# Patient Record
Sex: Female | Born: 1940 | Race: White | Hispanic: No | Smoking: Current every day smoker
Health system: Southern US, Community
[De-identification: ages and names within clinical notes are randomized; demographics above are authoritative.]

## PROBLEM LIST (undated history)

## (undated) DIAGNOSIS — J349 Unspecified disorder of nose and nasal sinuses: Secondary | ICD-10-CM

## (undated) DIAGNOSIS — IMO0001 Reserved for inherently not codable concepts without codable children: Secondary | ICD-10-CM

## (undated) DIAGNOSIS — D649 Anemia, unspecified: Secondary | ICD-10-CM

## (undated) DIAGNOSIS — K649 Unspecified hemorrhoids: Secondary | ICD-10-CM

## (undated) DIAGNOSIS — M797 Fibromyalgia: Secondary | ICD-10-CM

## (undated) DIAGNOSIS — J439 Emphysema, unspecified: Secondary | ICD-10-CM

## (undated) DIAGNOSIS — G64 Other disorders of peripheral nervous system: Secondary | ICD-10-CM

## (undated) DIAGNOSIS — K219 Gastro-esophageal reflux disease without esophagitis: Secondary | ICD-10-CM

## (undated) DIAGNOSIS — S82899A Other fracture of unspecified lower leg, initial encounter for closed fracture: Secondary | ICD-10-CM

## (undated) DIAGNOSIS — I219 Acute myocardial infarction, unspecified: Secondary | ICD-10-CM

## (undated) DIAGNOSIS — J45909 Unspecified asthma, uncomplicated: Secondary | ICD-10-CM

## (undated) DIAGNOSIS — Z972 Presence of dental prosthetic device (complete) (partial): Secondary | ICD-10-CM

## (undated) DIAGNOSIS — I10 Essential (primary) hypertension: Secondary | ICD-10-CM

## (undated) HISTORY — DX: Unspecified disorder of nose and nasal sinuses: J34.9

## (undated) HISTORY — PX: ABDOMINAL HYSTERECTOMY: SHX81

## (undated) HISTORY — DX: Unspecified asthma, uncomplicated: J45.909

## (undated) HISTORY — DX: Other fracture of unspecified lower leg, initial encounter for closed fracture: S82.899A

## (undated) HISTORY — DX: Acute myocardial infarction, unspecified: I21.9

## (undated) HISTORY — PX: APPENDECTOMY: SHX54

## (undated) HISTORY — DX: Emphysema, unspecified: J43.9

## (undated) HISTORY — DX: Other disorders of peripheral nervous system: G64

## (undated) HISTORY — DX: Essential (primary) hypertension: I10

---

## 1968-10-14 HISTORY — PX: TUBAL LIGATION: SHX77

## 1978-10-14 HISTORY — PX: PARTIAL HYSTERECTOMY: SHX80

## 2005-04-19 ENCOUNTER — Emergency Department: Payer: Self-pay | Admitting: Emergency Medicine

## 2005-04-19 ENCOUNTER — Other Ambulatory Visit: Payer: Self-pay

## 2006-05-12 ENCOUNTER — Ambulatory Visit: Payer: Self-pay | Admitting: Gastroenterology

## 2006-05-12 LAB — HM COLONOSCOPY

## 2006-07-15 ENCOUNTER — Ambulatory Visit: Payer: Self-pay | Admitting: Otolaryngology

## 2006-08-06 ENCOUNTER — Inpatient Hospital Stay: Payer: Self-pay | Admitting: Otolaryngology

## 2006-08-07 ENCOUNTER — Other Ambulatory Visit: Payer: Self-pay

## 2006-10-14 HISTORY — PX: OTHER SURGICAL HISTORY: SHX169

## 2008-01-29 ENCOUNTER — Ambulatory Visit: Payer: Self-pay | Admitting: Family Medicine

## 2010-09-24 ENCOUNTER — Ambulatory Visit: Payer: Self-pay | Admitting: Family Medicine

## 2011-03-06 ENCOUNTER — Ambulatory Visit: Payer: Self-pay | Admitting: Neurology

## 2011-05-25 ENCOUNTER — Inpatient Hospital Stay: Payer: Self-pay | Admitting: Internal Medicine

## 2011-06-19 ENCOUNTER — Observation Stay: Payer: Self-pay | Admitting: Internal Medicine

## 2011-10-15 DIAGNOSIS — I219 Acute myocardial infarction, unspecified: Secondary | ICD-10-CM

## 2011-10-15 HISTORY — DX: Acute myocardial infarction, unspecified: I21.9

## 2011-10-15 HISTORY — PX: ANGIOPLASTY: SHX39

## 2011-11-29 LAB — HM MAMMOGRAPHY

## 2012-03-05 ENCOUNTER — Ambulatory Visit: Payer: Self-pay | Admitting: Cardiology

## 2013-02-13 IMAGING — CT CT HEAD WITHOUT CONTRAST
2 series · 16 of 30 positions shown, 20 images · non-contrast
Comparison: none

REASON FOR EXAM: CVA
COMMENTS:   May transport without cardiac monitor

PROCEDURE:     CT  - CT HEAD WITHOUT CONTRAST  - May 25, 2011  [DATE]
RESULT:     Technique: Helical 5mm sections were obtained from the skull
base to the vertex without administration of intravenous contrast.

[Series 2: without · axial · non-contrast · 0.43mm/px · z∈[-179,-59]mm · 13 of 29 slices shown, 17 images]
[im 3/29  brain]
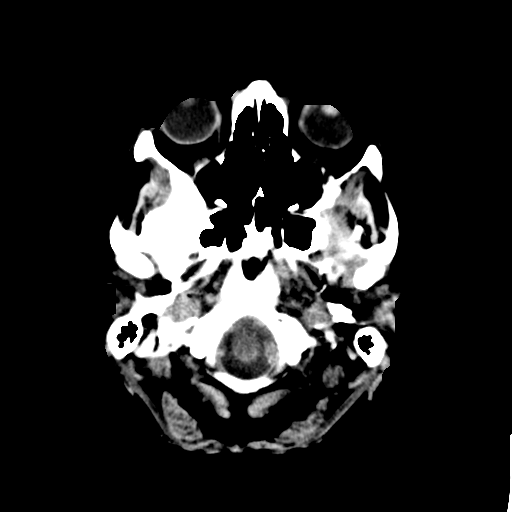
[im 3/29  bone]
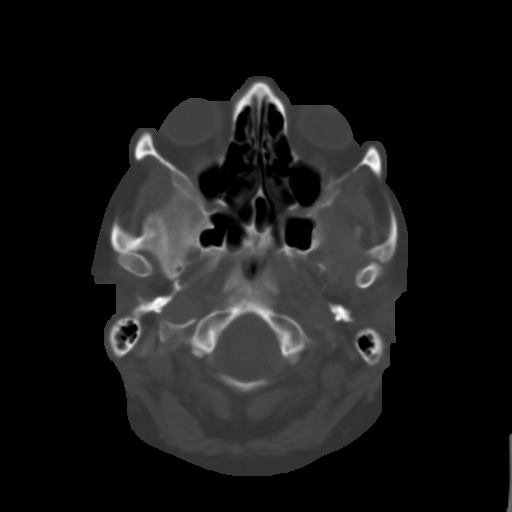
[im 5/29  brain]
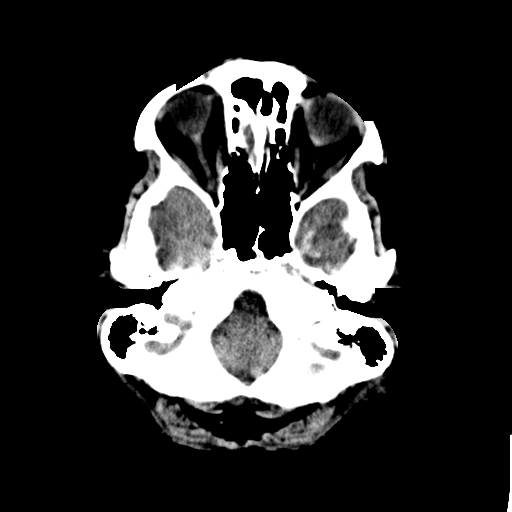
[im 7/29  brain]
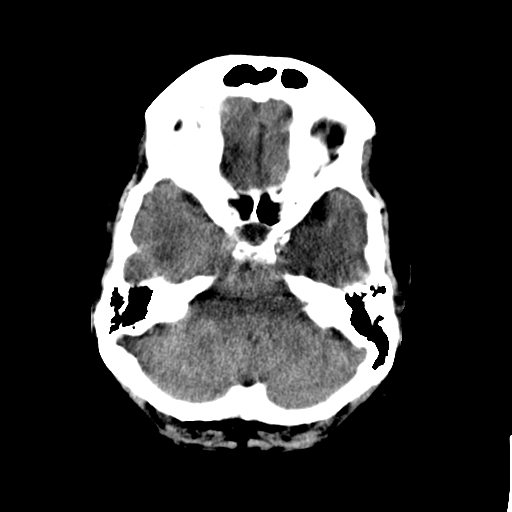
[im 9/29  brain]
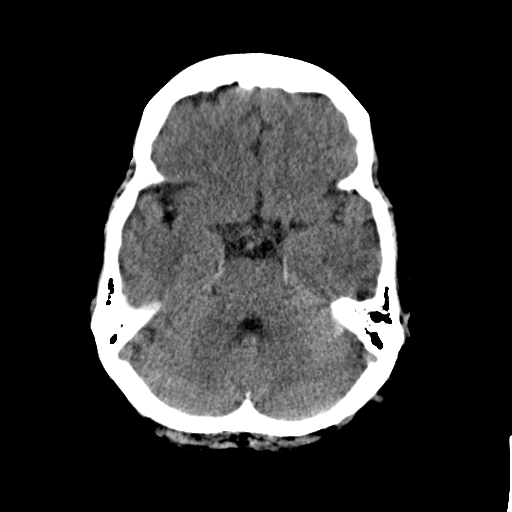
[im 11/29  brain]
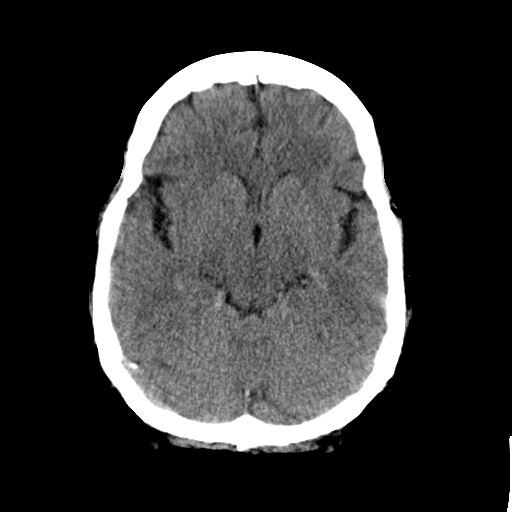
[im 11/29  bone]
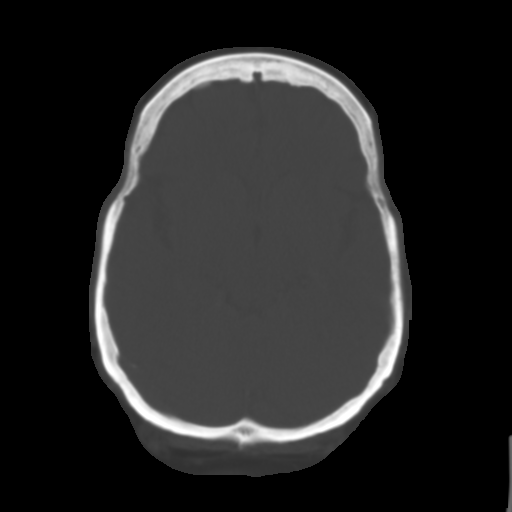
[im 13/29  brain]
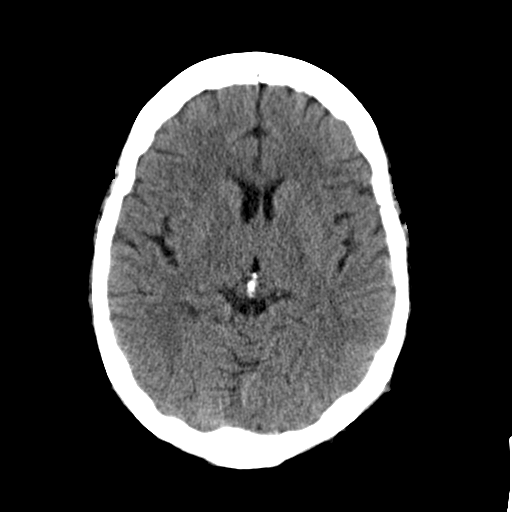
[im 15/29  brain]
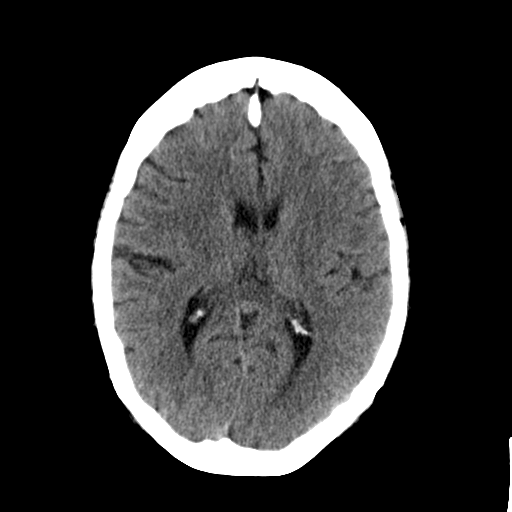
[im 17/29  brain]
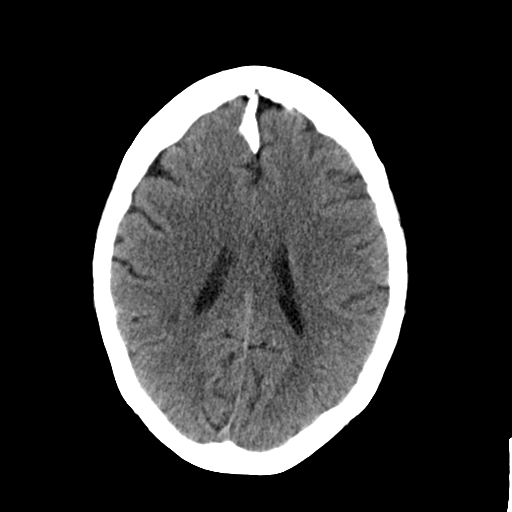
[im 19/29  brain]
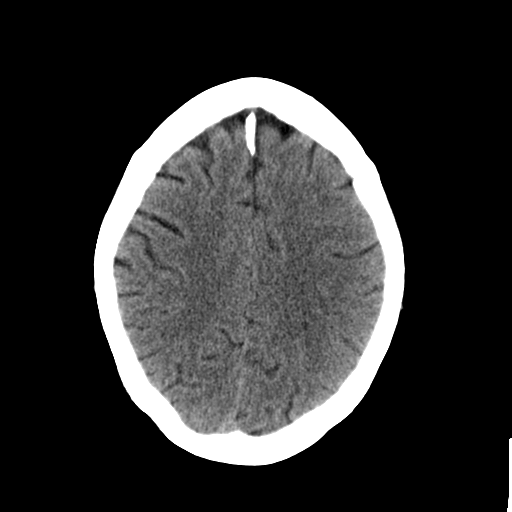
[im 19/29  bone]
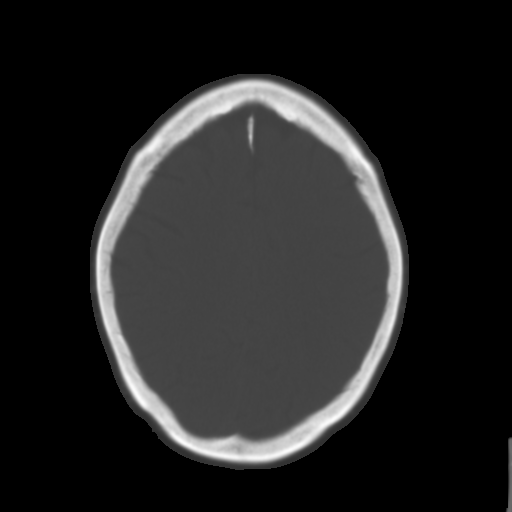
[im 21/29  brain]
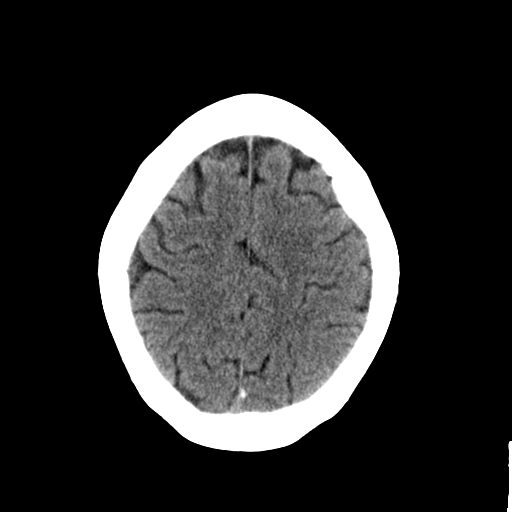
[im 23/29  brain]
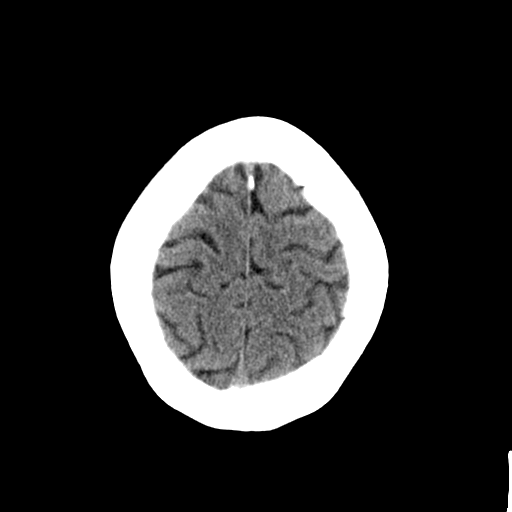
[im 25/29  brain]
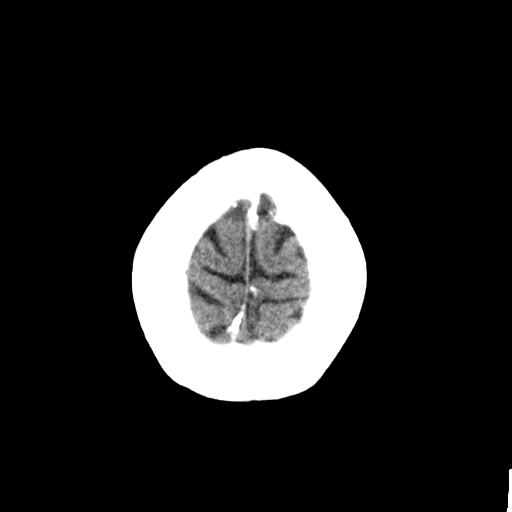
[im 27/29  brain]
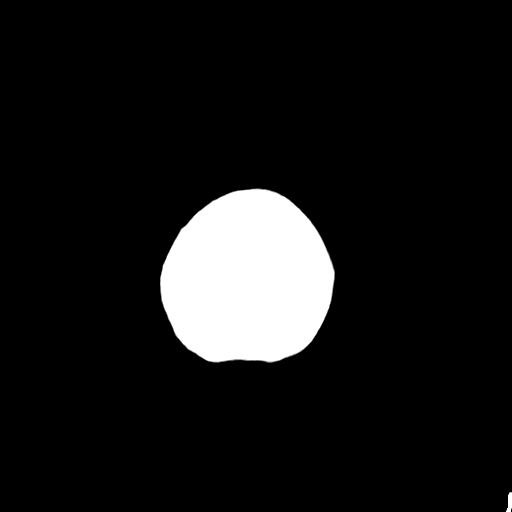
[im 27/29  bone]
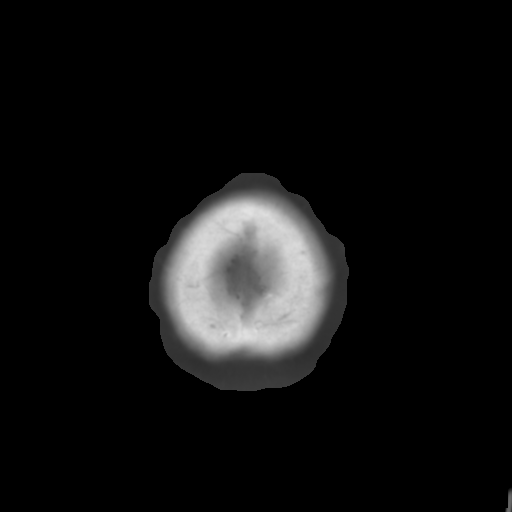

[Series 3: bone · axial · 0.43mm/px · z∈[-179,-139]mm · 3 of 29 slices shown]
[im 3/29  bone]
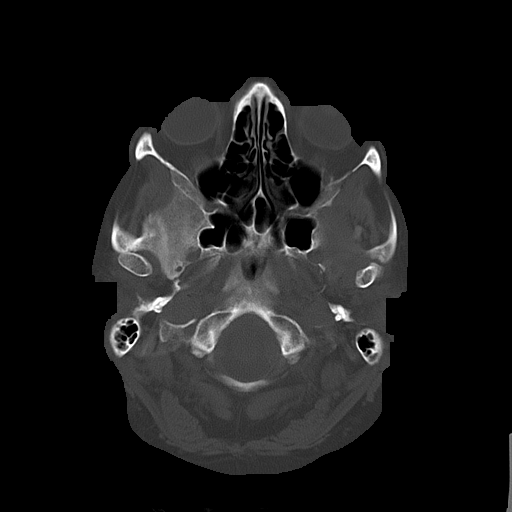
[im 7/29  bone]
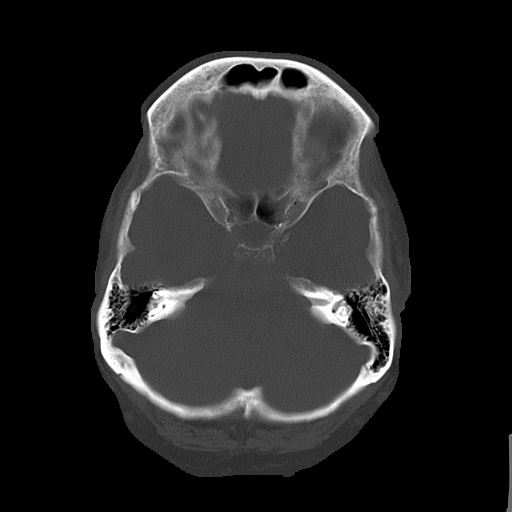
[im 11/29  bone]
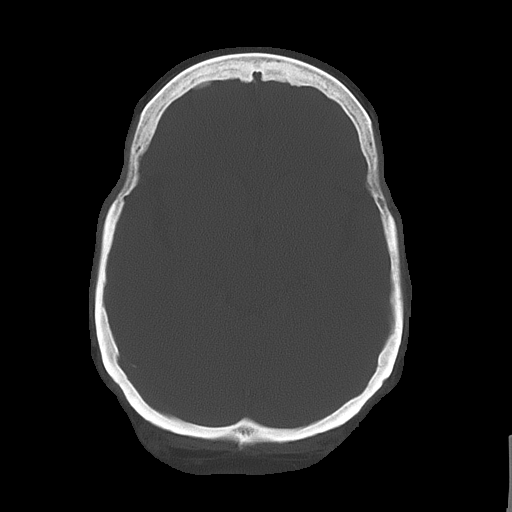

[16 of 30 positions shown; findings below may reference images not displayed]

FINDINGS: There is not evidence of intra-axial fluid collections. There is
no evidence of acute hemorrhage or secondary signs reflecting mass effect or
subacute or chronic focal territorial infarction. The osseous structures
demonstrate no evidence of a depressed skull fracture. If there is
persistent concern clinical follow-up with MRI is recommended.
IMPRESSION: 1. No evidence of acute intracranial abnormalitites.
2. Dr. Lawyer of the emergency department was informed of these findings
via a preliminary faxed report.

## 2013-02-13 IMAGING — CR DG CHEST 2V
1 series · 2 of 2 positions shown · non-contrast
Comparison: none

REASON FOR EXAM: CVA
COMMENTS:   May transport without cardiac monitor

[Series 1: view not recorded · 0.17mm/px · 2 of 2 slices shown]
[im 1/2]
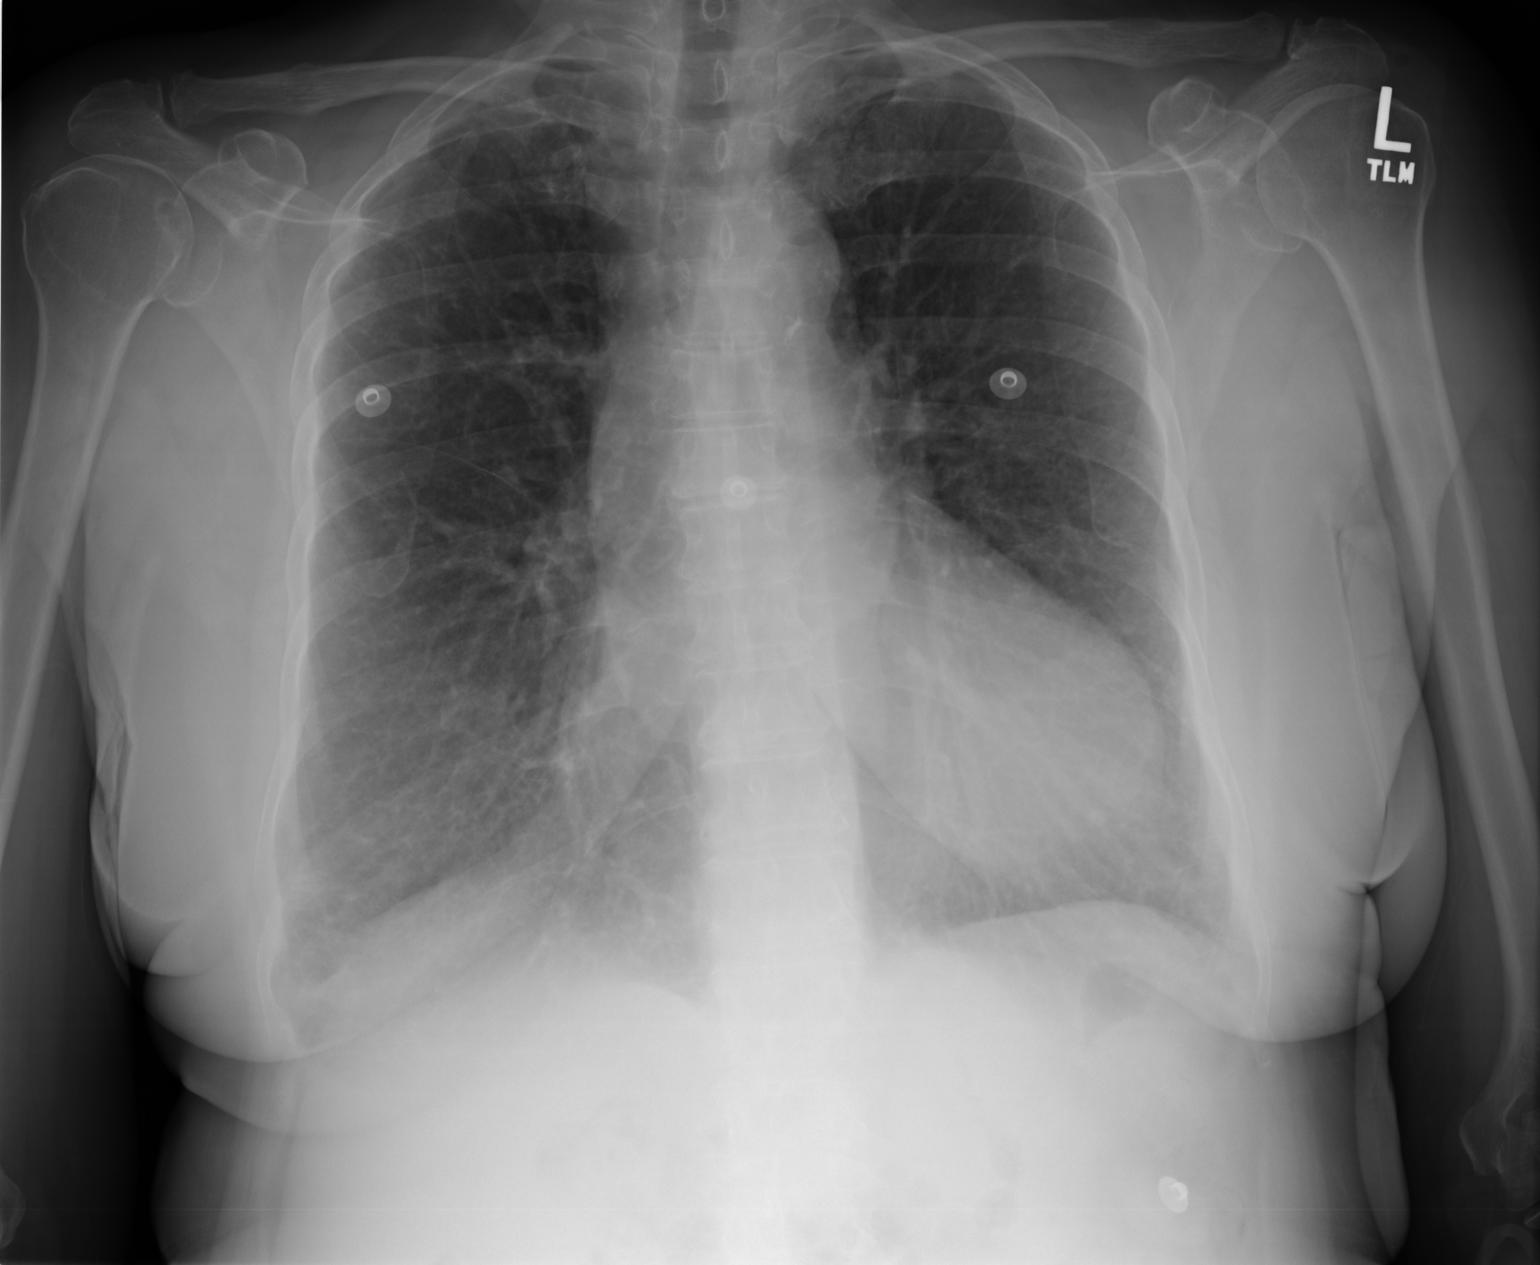
[im 2/2]
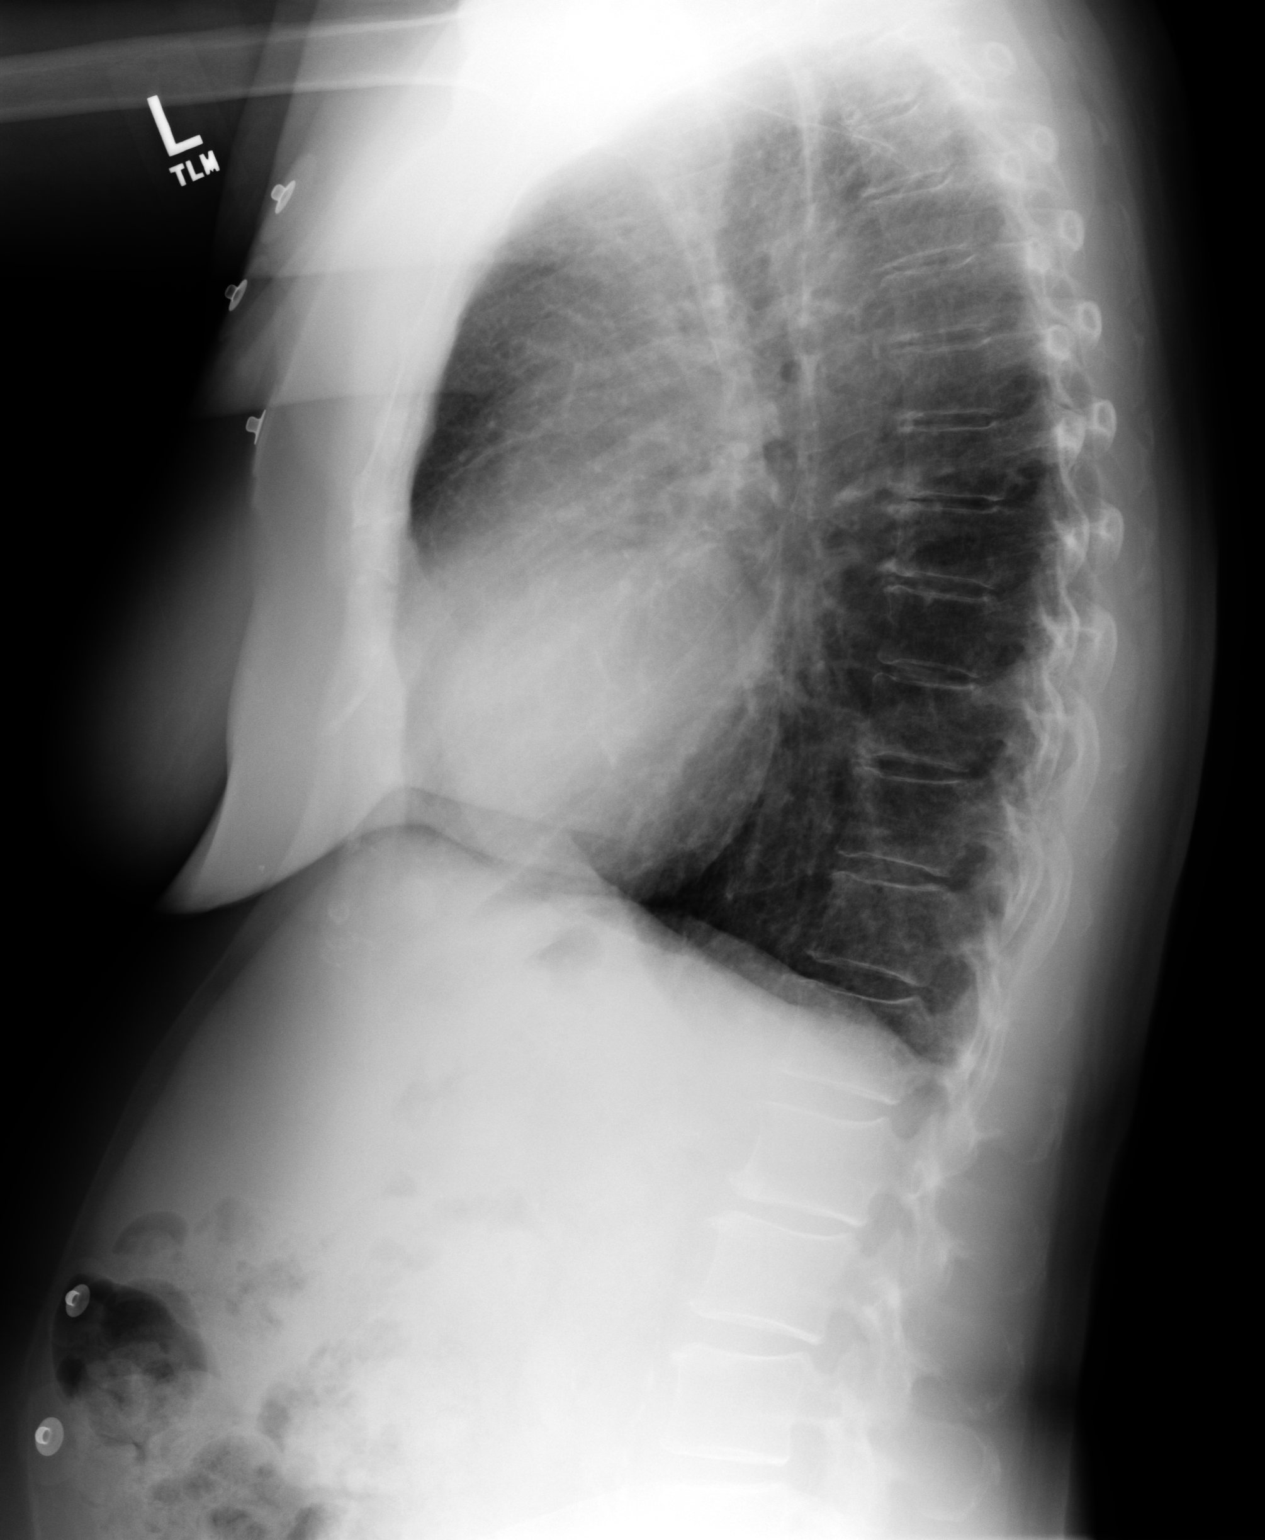

[2 of 2 positions shown; findings below may reference images not displayed]

PROCEDURE:     DXR - DXR CHEST PA (OR AP) AND LATERAL  - May 25, 2011  [DATE]

RESULT:     Comparison is made to a prior study dated 09/24/2010.

There is mild prominence of the interstitial markings. No focal regions of
consolidation or focal infiltrates are appreciated. The cardiac silhouette
is enlarged. The visualized bony skeleton is unremarkable.
IMPRESSION: 1. Pulmonary vascular congestion without focal regions of consolidation or
focal infiltrates.

## 2013-08-04 ENCOUNTER — Other Ambulatory Visit: Payer: Self-pay | Admitting: Ophthalmology

## 2014-03-16 DIAGNOSIS — I219 Acute myocardial infarction, unspecified: Secondary | ICD-10-CM | POA: Insufficient documentation

## 2014-03-16 DIAGNOSIS — M797 Fibromyalgia: Secondary | ICD-10-CM | POA: Insufficient documentation

## 2014-03-20 ENCOUNTER — Emergency Department: Payer: Self-pay | Admitting: Emergency Medicine

## 2014-03-20 LAB — CBC WITH DIFFERENTIAL/PLATELET
Basophil #: 0.1 10*3/uL (ref 0.0–0.1)
Basophil %: 0.9 %
Eosinophil #: 0.2 10*3/uL (ref 0.0–0.7)
Eosinophil %: 2.4 %
HCT: 30.1 % — ABNORMAL LOW (ref 35.0–47.0)
HGB: 9.4 g/dL — ABNORMAL LOW (ref 12.0–16.0)
Lymphocyte #: 2.7 10*3/uL (ref 1.0–3.6)
Lymphocyte %: 36 %
MCH: 23.9 pg — ABNORMAL LOW (ref 26.0–34.0)
MCHC: 31.4 g/dL — AB (ref 32.0–36.0)
MCV: 76 fL — ABNORMAL LOW (ref 80–100)
MONO ABS: 0.7 x10 3/mm (ref 0.2–0.9)
MONOS PCT: 8.8 %
NEUTROS PCT: 51.9 %
Neutrophil #: 3.9 10*3/uL (ref 1.4–6.5)
Platelet: 350 10*3/uL (ref 150–440)
RBC: 3.95 10*6/uL (ref 3.80–5.20)
RDW: 17.8 % — AB (ref 11.5–14.5)
WBC: 7.6 10*3/uL (ref 3.6–11.0)

## 2014-03-20 LAB — BASIC METABOLIC PANEL
Anion Gap: 6 — ABNORMAL LOW (ref 7–16)
BUN: 10 mg/dL (ref 7–18)
CALCIUM: 8.3 mg/dL — AB (ref 8.5–10.1)
Chloride: 105 mmol/L (ref 98–107)
Co2: 25 mmol/L (ref 21–32)
Creatinine: 0.86 mg/dL (ref 0.60–1.30)
EGFR (African American): >60
EGFR (Non-African Amer.): >60
Glucose: 86 mg/dL (ref 65–99)
Osmolality: 270 (ref 275–301)
POTASSIUM: 3.8 mmol/L (ref 3.5–5.1)
Sodium: 136 mmol/L (ref 136–145)

## 2014-03-20 LAB — TROPONIN I: Troponin-I: <0.02 ng/mL

## 2014-03-20 LAB — PROTIME-INR
INR: 1.1
PROTHROMBIN TIME: 13.9 s (ref 11.5–14.7)

## 2014-03-21 LAB — URINALYSIS, COMPLETE
BLOOD: NEGATIVE
Bacteria: NONE SEEN
Bilirubin,UR: NEGATIVE
Glucose,UR: NEGATIVE mg/dL (ref 0–75)
Ketone: NEGATIVE
Leukocyte Esterase: NEGATIVE
Nitrite: NEGATIVE
PH: 6 (ref 4.5–8.0)
Protein: NEGATIVE
Specific Gravity: 1.012 (ref 1.003–1.030)
WBC UR: <1 /HPF (ref 0–5)

## 2014-04-11 ENCOUNTER — Ambulatory Visit (INDEPENDENT_AMBULATORY_CARE_PROVIDER_SITE_OTHER): Payer: Medicare Other | Admitting: Pulmonary Disease

## 2014-04-11 ENCOUNTER — Encounter: Payer: Self-pay | Admitting: Pulmonary Disease

## 2014-04-11 VITALS — BP 126/64 | HR 63 | Ht 59.0 in | Wt 147.0 lb

## 2014-04-11 DIAGNOSIS — I25810 Atherosclerosis of coronary artery bypass graft(s) without angina pectoris: Secondary | ICD-10-CM | POA: Insufficient documentation

## 2014-04-11 DIAGNOSIS — R0600 Dyspnea, unspecified: Secondary | ICD-10-CM | POA: Insufficient documentation

## 2014-04-11 DIAGNOSIS — R0989 Other specified symptoms and signs involving the circulatory and respiratory systems: Secondary | ICD-10-CM

## 2014-04-11 DIAGNOSIS — R0609 Other forms of dyspnea: Secondary | ICD-10-CM

## 2014-04-11 DIAGNOSIS — Z720 Tobacco use: Secondary | ICD-10-CM

## 2014-04-11 DIAGNOSIS — J438 Other emphysema: Secondary | ICD-10-CM

## 2014-04-11 DIAGNOSIS — F17200 Nicotine dependence, unspecified, uncomplicated: Secondary | ICD-10-CM

## 2014-04-11 NOTE — Progress Notes (Signed)
Subjective:    Patient ID: Brittany Acevedo, female    DOB: [birth year], 73 y.o.   MRN: [id number]  HPI  This is a very pleasant 73 year old female who comes to me today on self-referral for evaluation of shortness of breath. She has smoked one to one and a half packs of cigarettes daily for greater than 40 years and continues to smoke. She was recently seen in the emergency department at [facility] for shortness of breath and cough. She says that she was told that she had emphysema and likely COPD and so she came to me for further evaluation.  She and her son provide a history jointly. They tell me that she has experienced increasing shortness of breath as well as body aches and global fatigue for the last several months. She is unable to climb a flight of stairs without difficulty and she says that running a vacuum cleaner makes her short of breath. She says that she has to go to Dr. one or 2 times a year to get treated for respiratory infections. Most recently she was seen in the [facility] emergency department.  She was recently given an albuterol inhaler and she says this helps some. She had a normal childhood without respiratory illnesses.  She denies chest pain or leg swelling. She has a past medical history significant for coronary artery disease. She had an MI in 2013.  Past Medical History  Diagnosis Date  . Hypertension   . Heart attack 2013  . Asthma   . Emphysema lung   . Sinus trouble   . Peripheral nervous system disorder      Family History  Problem Relation Age of Onset  . Emphysema Cousin   . Asthma Sister   . Asthma Other   . Heart disease Father   . Heart disease Maternal Uncle   . Heart disease Maternal Grandmother   . Clotting disorder Sister   . Rheum arthritis Son   . Rheum arthritis Father   . Rheum arthritis Brother   . Rheum arthritis Sister   . Cancer Other      History   Social History  . Marital Status: Married    Spouse  Name: N/A    Number of Children: N/A  . Years of Education: N/A   Occupational History  . Not on file.   Social History Main Topics  . Smoking status: Current Every Day Smoker -- 1.50 packs/day for 55 years    Types: Cigarettes  . Smokeless tobacco: Never Used     Comment: Down to 1 cig/day X3 weeks.   . Alcohol Use: No  . Drug Use: No  . Sexual Activity: Not on file   Other Topics Concern  . Not on file   Social History Narrative  . No narrative on file     Allergies  Allergen Reactions  . Darvon [Propoxyphene]     hives     No outpatient prescriptions prior to visit.   No facility-administered medications prior to visit.      Review of Systems  Constitutional: Positive for appetite change and unexpected weight change. Negative for fever.  HENT: Positive for congestion, ear pain, sinus pressure, sore throat and trouble swallowing. Negative for dental problem, nosebleeds, postnasal drip, rhinorrhea and sneezing.   Eyes: Negative for redness and itching.  Respiratory: Positive for cough and shortness of breath. Negative for chest tightness and wheezing.   Cardiovascular: Positive for leg swelling. Negative for palpitations.  Gastrointestinal: Negative for nausea and vomiting.  Genitourinary: Negative for dysuria.  Musculoskeletal: Positive for joint swelling.  Skin: Negative for rash.  Neurological: Negative for headaches.  Hematological: Does not bruise/bleed easily.  Psychiatric/Behavioral: Positive for dysphoric mood. The patient is nervous/anxious.        Objective:   Physical Exam  Filed Vitals:   04/11/14 1443  BP: 126/64  Pulse: 63  Height: 4\' 11"  (1.499 m)  Weight: 147 lb (66.679 kg)  SpO2: 98%   Ambulate 500 feet on room air and oxygen saturation remained above 99%  Gen: well appearing, no acute distress HEENT: NCAT, PERRL, EOMi, OP clear, neck supple without masses PULM: CTA B CV: RRR, no mgr, no JVD AB: BS+, soft, nontender, no hsm Ext:  warm, no edema, no clubbing, no cyanosis Derm: no rash or skin breakdown Neuro: A&Ox4, CN II-XII intact, strength 5/5 in all 4 extremities  04/11/2014 simple spirometry normal     Assessment & Plan:   Dyspnea Given her prolonged smoking history it is very reasonable to look for COPD. Fortunately, today her simple spirometry, pulmonary exam, and ambulatory oximetry were all normal. Therefore based on this I cannot find evidence of lung disease. However, I would like to get a set of full pulmonary function testing for thoroughness to make sure that there is no clear evidence of lung disease and I would like to request the records of her most recent ER visit to [place] so that I can see the results of recent chest imaging.  If I cannot find clear evidence of lung disease and she may need to go back to see her cardiologist to evaluate her shortness of breath. I explained to her that the differential diagnosis of shortness of breath is broad and so therefore when you get records of recent blood work and other testing from her recent ER visit.  Plan: -Obtain records from recent ER visit -Quit smoking -Full pulmonary function test -Followup 3-4 weeks  Tobacco abuse Advised at length today to quit smoking. Discussed all the negative ramifications of ongoing tobacco use. Recommended that she call the [place] quit line for assistance with quitting smoking.  CAD (coronary artery disease) of artery bypass graft If we cannot find clear evidence of lung disease then she may need to see her cardiologist again for further evaluation of her dyspnea.    Updated Medication List Outpatient Encounter Prescriptions as of 04/11/2014  Medication Sig  . acetaminophen (TYLENOL) 500 MG tablet Take 500 mg by mouth every 6 (six) hours as needed.  Marland Kitchen albuterol (PROVENTIL HFA;VENTOLIN HFA) 108 (90 BASE) MCG/ACT inhaler Inhale 2 puffs into the lungs every 6 (six) hours as needed for wheezing or shortness of  breath.  Marland Kitchen aspirin 325 MG tablet Take 325 mg by mouth daily.  . calcium-vitamin D (OSCAL) 250-125 MG-UNIT per tablet Take 1 tablet by mouth daily.  . citalopram (CELEXA) 20 MG tablet Take 20 mg by mouth daily.  . clonazePAM (KLONOPIN) 0.5 MG tablet Take 0.5 mg by mouth 2 (two) times daily.  . clopidogrel (PLAVIX) 75 MG tablet Take 75 mg by mouth daily.  Marland Kitchen donepezil (ARICEPT) 5 MG tablet Take 5 mg by mouth at bedtime.  . isosorbide dinitrate (ISORDIL) 30 MG tablet Take 30 mg by mouth daily.  [name] Oil 1000 MG CAPS Take 1 capsule by mouth daily.  . metoprolol succinate (TOPROL-XL) 25 MG 24 hr tablet Take 25 mg by mouth 2 (two) times daily.  . pantoprazole (PROTONIX)  40 MG tablet Take 40 mg by mouth daily.  . traMADol (ULTRAM) 50 MG tablet Take by mouth every 6 (six) hours as needed.

## 2014-04-11 NOTE — Assessment & Plan Note (Signed)
Advised at length today to quit smoking. Discussed all the negative ramifications of ongoing tobacco use. Recommended that she call the [place] quit line for assistance with quitting smoking.

## 2014-04-11 NOTE — Patient Instructions (Signed)
The test today showed that you do not have COPD We will request a full pulmonary function test from [facility] We will also request the records from your recent visit to [facility] We will see you back in 3-4 weeks or sooner if needed

## 2014-04-11 NOTE — Assessment & Plan Note (Signed)
Given her prolonged smoking history it is very reasonable to look for COPD. Fortunately, today her simple spirometry, pulmonary exam, and ambulatory oximetry were all normal. Therefore based on this I cannot find evidence of lung disease. However, I would like to get a set of full pulmonary function testing for thoroughness to make sure that there is no clear evidence of lung disease and I would like to request the records of her most recent ER visit to [place] so that I can see the results of recent chest imaging.  If I cannot find clear evidence of lung disease and she may need to go back to see her cardiologist to evaluate her shortness of breath. I explained to her that the differential diagnosis of shortness of breath is broad and so therefore when you get records of recent blood work and other testing from her recent ER visit.  Plan: -Obtain records from recent ER visit -Quit smoking -Full pulmonary function test -Followup 3-4 weeks

## 2014-04-11 NOTE — Assessment & Plan Note (Signed)
If we cannot find clear evidence of lung disease then she may need to see her cardiologist again for further evaluation of her dyspnea.

## 2014-04-12 ENCOUNTER — Ambulatory Visit: Payer: Self-pay | Admitting: Pulmonary Disease

## 2014-04-12 LAB — PULMONARY FUNCTION TEST

## 2014-04-13 ENCOUNTER — Encounter: Payer: Self-pay | Admitting: Pulmonary Disease

## 2014-04-14 ENCOUNTER — Telehealth: Payer: Self-pay

## 2014-04-14 NOTE — Telephone Encounter (Signed)
Spoke with pt, she is aware of results and recs.  She is unsure if she has had an echo in the past year.  I advised that I would request the records from [facility] and if there isn't a recent one that BQ would like to order one.  Records request for discharge summary, recent labs, and last echo has been faxed to [facility].  Will look out for this.  Nothing further needed at this time.

## 2014-04-14 NOTE — Telephone Encounter (Signed)
Message copied by Velvet Bathe on Thu Apr 14, 2014  2:38 PM ------      Message from: Max Fickle B      Created: Wed Apr 13, 2014 10:01 PM       A,      Please let her know that her PFTs showed a decreased ability to get oxygen into her blood stream.  So we need to see her recent [facility] records and labwork. Further, if she hasn't had an echo in the last year she needs another for shortness of breath.            Thanks      B ------

## 2014-04-18 ENCOUNTER — Telehealth: Payer: Self-pay | Admitting: Pulmonary Disease

## 2014-04-18 NOTE — Telephone Encounter (Signed)
[facility] PT line busy x 3 wcb 

## 2014-04-19 NOTE — Telephone Encounter (Signed)
Called and spoke with pt and she stated that she was called yesterday evening and advised by someone that [facility] wanted her to come in on 7-9 for a test.  She stated that this is to be done at [facility].  From the looks of the last OV that this is to be the PFT at the hospital.  Called and spoke with the pt and she is aware.

## 2014-04-20 ENCOUNTER — Encounter: Payer: Self-pay | Admitting: Pulmonary Disease

## 2014-04-26 ENCOUNTER — Telehealth: Payer: Self-pay

## 2014-04-26 NOTE — Telephone Encounter (Signed)
Message copied by Velvet Bathe on Tue Apr 26, 2014 10:44 AM ------      Message from: Max Fickle B      Created: Wed Apr 20, 2014  3:53 PM       A,            FYI we requested recent labs, echo and ER notes for this lady from [facility].  All they sent was a D/C summary from 2012.            B ------

## 2014-04-26 NOTE — Telephone Encounter (Signed)
I've re-requested these from [facility] and will follow up accordingly with this.

## 2014-04-27 ENCOUNTER — Encounter: Payer: Self-pay | Admitting: Pulmonary Disease

## 2014-05-02 ENCOUNTER — Encounter: Payer: Self-pay | Admitting: Pulmonary Disease

## 2014-05-02 ENCOUNTER — Ambulatory Visit (INDEPENDENT_AMBULATORY_CARE_PROVIDER_SITE_OTHER): Payer: Medicare Other | Admitting: Pulmonary Disease

## 2014-05-02 VITALS — BP 132/76 | HR 68 | Ht 59.0 in | Wt 158.0 lb

## 2014-05-02 DIAGNOSIS — R0600 Dyspnea, unspecified: Secondary | ICD-10-CM

## 2014-05-02 DIAGNOSIS — R0609 Other forms of dyspnea: Secondary | ICD-10-CM

## 2014-05-02 DIAGNOSIS — F17200 Nicotine dependence, unspecified, uncomplicated: Secondary | ICD-10-CM

## 2014-05-02 DIAGNOSIS — D649 Anemia, unspecified: Secondary | ICD-10-CM | POA: Insufficient documentation

## 2014-05-02 DIAGNOSIS — R0989 Other specified symptoms and signs involving the circulatory and respiratory systems: Secondary | ICD-10-CM

## 2014-05-02 DIAGNOSIS — D508 Other iron deficiency anemias: Secondary | ICD-10-CM

## 2014-05-02 DIAGNOSIS — Z720 Tobacco use: Secondary | ICD-10-CM

## 2014-05-02 NOTE — Progress Notes (Signed)
Subjective:    Patient ID: Brittany Acevedo, female    DOB: [birth year], 73 y.o.   MRN: [id number]  Synopsis: Referred to [facility] in 2015 for dyspnea.  Previous heavy smoker.  04/11/2014 simple spirometry normal 04/12/2013 Full PFT> Ratio 79%, FEV1 1.50L (95% pred, 2% change with BD), TLC 3.76L (94% pred), RV 1.78L (109% pred), DLCO 11.1 (63% pred) 03/2014 hgb 9.4, Hct 30.1  HPI  05/02/2014 ROV > Brittany Acevedo tells me that she started smoking again about 2-1/2 weeks ago. She has been coughing some more since this and has produced a little bit of mucus. She doesn't think that her shortness of breath has changed or is any worse than when she saw me the last time. She continues to use albuterol about once per week. She says that this helps a little bit when she takes it. In general, she feels that her shortness of breath has not limited her at home, however her son disagrees somewhat. He says that she is still short of breath when walking across the parking lot in performing basic activities. She has not noted any bleeding at home. She has not been back to see her primary care physician in some time.  Past Medical History  Diagnosis Date  . Hypertension   . Heart attack 2013  . Asthma   . Emphysema lung   . Sinus trouble   . Peripheral nervous system disorder       Review of Systems  Constitutional: Positive for fatigue. Negative for fever and chills.  HENT: Negative for postnasal drip, rhinorrhea and sinus pressure.   Respiratory: Positive for cough and shortness of breath. Negative for wheezing.   Cardiovascular: Negative for chest pain, palpitations and leg swelling.       Objective:   Physical Exam  Filed Vitals:   05/02/14 1046  BP: 132/76  Pulse: 68  Height: 4\' 11"  (1.499 m)  Weight: 158 lb (71.668 kg)  SpO2: 98%   Ra  Gen: well appearing, no acute distress HEENT: NCAT,EOMi, OP clear,  PULM: CTA B CV: RRR, no mgr, no JVD AB: BS+, soft, nontender, no hsm Ext:  warm, no edema, no clubbing, no cyanosis  03/2014 CXR reviewed: vascular congestion, possibly emphysema     Assessment & Plan:   Dyspnea Despite years of tobacco abuse she had surprisingly normal pulmonary function testing. However, her DLCO was depressed. This is consistent with her hemoglobin of 9.4 which was found during her ER visit in June. Her ambulatory oximetry, lung exam, and chest imaging or not suggestive of significant lung pathology.  I explained to she and her son that she may have some mild emphysema from years of tobacco use but currently I am most concerned about the anemia. This can certainly be an explanation for her dyspnea. She has not seen her primary care physician in quite a long time. I stressed to her the importance of following up with her primary care physician this week to further workup the anemia.  I am disappointed that she started smoking again.  Plan: -Again I stressed the importance of smoking cessation -Followup with primary care physician this week to evaluate the anemia -Followup with me in 3-4 months    Tobacco abuse Advised at length to quit today  Anemia Again, I think this is the primary problem.  I recommended that she see Dr. Elease Hashimoto this week for further evaluation.    Updated Medication List Outpatient Encounter Prescriptions as of 05/02/2014  Medication  Sig  . acetaminophen (TYLENOL) 500 MG tablet Take 500 mg by mouth every 6 (six) hours as needed.  Marland Kitchen. albuterol (PROVENTIL HFA;VENTOLIN HFA) 108 (90 BASE) MCG/ACT inhaler Inhale 2 puffs into the lungs every 6 (six) hours as needed for wheezing or shortness of breath.  Marland Kitchen. aspirin 325 MG tablet Take 325 mg by mouth daily.  . calcium-vitamin D (OSCAL) 250-125 MG-UNIT per tablet Take 1 tablet by mouth daily.  . citalopram (CELEXA) 20 MG tablet Take 20 mg by mouth daily.  . clonazePAM (KLONOPIN) 0.5 MG tablet Take 0.5 mg by mouth 2 (two) times daily.  . clopidogrel (PLAVIX) 75 MG tablet Take  75 mg by mouth daily.  Marland Kitchen. donepezil (ARICEPT) 5 MG tablet Take 5 mg by mouth at bedtime.  . isosorbide dinitrate (ISORDIL) 30 MG tablet Take 30 mg by mouth daily.  Brittany Acevedo. Krill Oil 1000 MG CAPS Take 1 capsule by mouth daily.  . metoprolol succinate (TOPROL-XL) 25 MG 24 hr tablet Take 25 mg by mouth 2 (two) times daily.  . traMADol (ULTRAM) 50 MG tablet Take by mouth every 6 (six) hours as needed.  . pantoprazole (PROTONIX) 40 MG tablet Take 40 mg by mouth daily.

## 2014-05-02 NOTE — Assessment & Plan Note (Signed)
Advised at length to quit today 

## 2014-05-02 NOTE — Assessment & Plan Note (Signed)
Again, I think this is the primary problem.  I recommended that she see Dr. Elease Hashimoto this week for further evaluation.

## 2014-05-02 NOTE — Patient Instructions (Signed)
Quit smoking! Call 1-800-QUIT-NOW for free nicotine patches from the state of [place]  Go see your primary care doctor for your anemia  If you want a new primary care doctor, I recommend you call the [facility] [place] office, their number is : __________________  We will see you back in 4 months or sooner if needed

## 2014-05-02 NOTE — Assessment & Plan Note (Signed)
Despite years of tobacco abuse she had surprisingly normal pulmonary function testing. However, her DLCO was depressed. This is consistent with her hemoglobin of 9.4 which was found during her ER visit in June. Her ambulatory oximetry, lung exam, and chest imaging or not suggestive of significant lung pathology.  I explained to she and her son that she may have some mild emphysema from years of tobacco use but currently I am most concerned about the anemia. This can certainly be an explanation for her dyspnea. She has not seen her primary care physician in quite a long time. I stressed to her the importance of following up with her primary care physician this week to further workup the anemia.  I am disappointed that she started smoking again.  Plan: -Again I stressed the importance of smoking cessation -Followup with primary care physician this week to evaluate the anemia -Followup with me in 3-4 months

## 2014-05-17 ENCOUNTER — Encounter: Payer: Self-pay | Admitting: Pulmonary Disease

## 2014-06-06 DIAGNOSIS — D509 Iron deficiency anemia, unspecified: Secondary | ICD-10-CM | POA: Insufficient documentation

## 2014-06-08 ENCOUNTER — Ambulatory Visit: Payer: Self-pay | Admitting: Family Medicine

## 2014-11-16 DIAGNOSIS — F17200 Nicotine dependence, unspecified, uncomplicated: Secondary | ICD-10-CM | POA: Diagnosis not present

## 2014-11-16 DIAGNOSIS — F0390 Unspecified dementia without behavioral disturbance: Secondary | ICD-10-CM | POA: Diagnosis not present

## 2014-11-16 DIAGNOSIS — E780 Pure hypercholesterolemia: Secondary | ICD-10-CM | POA: Diagnosis not present

## 2014-11-22 DIAGNOSIS — D649 Anemia, unspecified: Secondary | ICD-10-CM | POA: Diagnosis not present

## 2014-11-22 DIAGNOSIS — F0390 Unspecified dementia without behavioral disturbance: Secondary | ICD-10-CM | POA: Diagnosis not present

## 2014-11-22 DIAGNOSIS — E780 Pure hypercholesterolemia: Secondary | ICD-10-CM | POA: Diagnosis not present

## 2014-11-22 DIAGNOSIS — Z833 Family history of diabetes mellitus: Secondary | ICD-10-CM | POA: Diagnosis not present

## 2014-11-22 LAB — LIPID PANEL
Cholesterol: 278 mg/dL — AB (ref 0–200)
HDL: 37 mg/dL (ref 35–70)
LDL CALC: 218 mg/dL
Triglycerides: 117 mg/dL (ref 40–160)

## 2014-11-22 LAB — HEPATIC FUNCTION PANEL
ALT: 9 U/L (ref 7–35)
AST: 14 U/L (ref 13–35)

## 2014-11-22 LAB — CBC AND DIFFERENTIAL
HCT: 34 % — AB (ref 36–46)
Hemoglobin: 11.1 g/dL — AB (ref 12.0–16.0)
PLATELETS: 421 10*3/uL — AB (ref 150–399)
WBC: 8.3 10^3/mL

## 2014-11-22 LAB — BASIC METABOLIC PANEL
BUN: 8 mg/dL (ref 4–21)
Creatinine: 0.8 mg/dL (ref 0.5–1.1)
GLUCOSE: 103 mg/dL
Potassium: 4.3 mmol/L (ref 3.4–5.3)
Sodium: 137 mmol/L (ref 137–147)

## 2014-11-22 LAB — HEMOGLOBIN A1C: HEMOGLOBIN A1C: 6 % (ref 4.0–6.0)

## 2014-11-22 LAB — TSH: TSH: 1.73 u[IU]/mL (ref 0.41–5.90)

## 2014-12-06 ENCOUNTER — Ambulatory Visit: Payer: Self-pay | Admitting: Family Medicine

## 2014-12-06 DIAGNOSIS — R351 Nocturia: Secondary | ICD-10-CM | POA: Diagnosis not present

## 2014-12-06 DIAGNOSIS — E039 Hypothyroidism, unspecified: Secondary | ICD-10-CM | POA: Diagnosis not present

## 2014-12-06 DIAGNOSIS — R262 Difficulty in walking, not elsewhere classified: Secondary | ICD-10-CM | POA: Diagnosis not present

## 2014-12-06 DIAGNOSIS — J449 Chronic obstructive pulmonary disease, unspecified: Secondary | ICD-10-CM | POA: Diagnosis not present

## 2014-12-06 DIAGNOSIS — R0781 Pleurodynia: Secondary | ICD-10-CM | POA: Diagnosis not present

## 2014-12-06 DIAGNOSIS — S299XXA Unspecified injury of thorax, initial encounter: Secondary | ICD-10-CM | POA: Diagnosis not present

## 2014-12-08 ENCOUNTER — Emergency Department: Payer: Self-pay | Admitting: Emergency Medicine

## 2014-12-08 DIAGNOSIS — I10 Essential (primary) hypertension: Secondary | ICD-10-CM | POA: Diagnosis not present

## 2014-12-08 DIAGNOSIS — S2231XA Fracture of one rib, right side, initial encounter for closed fracture: Secondary | ICD-10-CM | POA: Diagnosis not present

## 2014-12-08 DIAGNOSIS — Z720 Tobacco use: Secondary | ICD-10-CM | POA: Diagnosis not present

## 2014-12-12 DIAGNOSIS — F329 Major depressive disorder, single episode, unspecified: Secondary | ICD-10-CM | POA: Diagnosis not present

## 2014-12-12 DIAGNOSIS — R413 Other amnesia: Secondary | ICD-10-CM | POA: Diagnosis not present

## 2014-12-12 DIAGNOSIS — S2231XS Fracture of one rib, right side, sequela: Secondary | ICD-10-CM | POA: Diagnosis not present

## 2015-04-06 ENCOUNTER — Other Ambulatory Visit: Payer: Self-pay | Admitting: Family Medicine

## 2015-04-06 MED ORDER — TRAMADOL HCL 50 MG PO TABS: 50.0000 mg | ORAL_TABLET | Freq: Four times a day (QID) | ORAL | Status: DC | PRN

## 2015-04-06 NOTE — Telephone Encounter (Signed)
Refill request for Tramadol 50 mg Last filled by MD on- 12/02/2013 #120 x2 Last Appt: 12/12/2014 Next Appt: none Please advise refill?

## 2015-04-06 NOTE — Telephone Encounter (Signed)
Pt contacted office for refill request on the following medications:  traMADol (ULTRAM) 50 MG tablet.  [address] [place].  NO#[phone]/[id number]  This a Dr Elease Hashimoto pt/MJ

## 2015-04-22 DIAGNOSIS — S2239XA Fracture of one rib, unspecified side, initial encounter for closed fracture: Secondary | ICD-10-CM | POA: Insufficient documentation

## 2015-04-22 DIAGNOSIS — F329 Major depressive disorder, single episode, unspecified: Secondary | ICD-10-CM | POA: Insufficient documentation

## 2015-04-22 DIAGNOSIS — F419 Anxiety disorder, unspecified: Secondary | ICD-10-CM | POA: Insufficient documentation

## 2015-04-22 DIAGNOSIS — R42 Dizziness and giddiness: Secondary | ICD-10-CM | POA: Insufficient documentation

## 2015-04-22 DIAGNOSIS — E876 Hypokalemia: Secondary | ICD-10-CM | POA: Insufficient documentation

## 2015-04-22 DIAGNOSIS — R51 Headache: Secondary | ICD-10-CM

## 2015-04-22 DIAGNOSIS — R413 Other amnesia: Secondary | ICD-10-CM | POA: Insufficient documentation

## 2015-04-22 DIAGNOSIS — F32A Depression, unspecified: Secondary | ICD-10-CM | POA: Insufficient documentation

## 2015-04-22 DIAGNOSIS — M359 Systemic involvement of connective tissue, unspecified: Secondary | ICD-10-CM | POA: Insufficient documentation

## 2015-04-22 DIAGNOSIS — R351 Nocturia: Secondary | ICD-10-CM | POA: Insufficient documentation

## 2015-04-22 DIAGNOSIS — F0390 Unspecified dementia without behavioral disturbance: Secondary | ICD-10-CM | POA: Insufficient documentation

## 2015-04-22 DIAGNOSIS — R296 Repeated falls: Secondary | ICD-10-CM | POA: Insufficient documentation

## 2015-04-22 DIAGNOSIS — R252 Cramp and spasm: Secondary | ICD-10-CM | POA: Insufficient documentation

## 2015-04-22 DIAGNOSIS — K219 Gastro-esophageal reflux disease without esophagitis: Secondary | ICD-10-CM | POA: Insufficient documentation

## 2015-04-22 DIAGNOSIS — R519 Headache, unspecified: Secondary | ICD-10-CM | POA: Insufficient documentation

## 2015-04-22 DIAGNOSIS — I2510 Atherosclerotic heart disease of native coronary artery without angina pectoris: Secondary | ICD-10-CM | POA: Insufficient documentation

## 2015-04-22 DIAGNOSIS — R0781 Pleurodynia: Secondary | ICD-10-CM | POA: Insufficient documentation

## 2015-04-22 DIAGNOSIS — Z6828 Body mass index (BMI) 28.0-28.9, adult: Secondary | ICD-10-CM | POA: Insufficient documentation

## 2015-04-22 DIAGNOSIS — Z833 Family history of diabetes mellitus: Secondary | ICD-10-CM | POA: Insufficient documentation

## 2015-04-22 DIAGNOSIS — Z9889 Other specified postprocedural states: Secondary | ICD-10-CM | POA: Insufficient documentation

## 2015-04-22 DIAGNOSIS — G4700 Insomnia, unspecified: Secondary | ICD-10-CM | POA: Insufficient documentation

## 2015-04-22 DIAGNOSIS — G8929 Other chronic pain: Secondary | ICD-10-CM | POA: Insufficient documentation

## 2015-05-02 ENCOUNTER — Ambulatory Visit: Payer: Self-pay | Admitting: Family Medicine

## 2015-05-03 ENCOUNTER — Ambulatory Visit: Payer: Self-pay | Admitting: Family Medicine

## 2015-05-09 ENCOUNTER — Encounter: Payer: Self-pay | Admitting: Family Medicine

## 2015-05-09 ENCOUNTER — Ambulatory Visit (INDEPENDENT_AMBULATORY_CARE_PROVIDER_SITE_OTHER): Payer: Medicare Other | Admitting: Family Medicine

## 2015-05-09 VITALS — BP 124/60 | HR 69 | Temp 99.6°F | Resp 16 | Wt 143.6 lb

## 2015-05-09 DIAGNOSIS — E7800 Pure hypercholesterolemia, unspecified: Secondary | ICD-10-CM

## 2015-05-09 DIAGNOSIS — R102 Pelvic and perineal pain: Secondary | ICD-10-CM | POA: Diagnosis not present

## 2015-05-09 DIAGNOSIS — R0781 Pleurodynia: Secondary | ICD-10-CM | POA: Diagnosis not present

## 2015-05-09 DIAGNOSIS — I25708 Atherosclerosis of coronary artery bypass graft(s), unspecified, with other forms of angina pectoris: Secondary | ICD-10-CM | POA: Diagnosis not present

## 2015-05-09 DIAGNOSIS — R739 Hyperglycemia, unspecified: Secondary | ICD-10-CM

## 2015-05-09 DIAGNOSIS — F0391 Unspecified dementia with behavioral disturbance: Secondary | ICD-10-CM | POA: Diagnosis not present

## 2015-05-09 DIAGNOSIS — K5900 Constipation, unspecified: Secondary | ICD-10-CM | POA: Diagnosis not present

## 2015-05-09 MED ORDER — MAGNESIUM CITRATE PO SOLN: 0.5000 | Freq: Once | ORAL | Status: DC

## 2015-05-09 MED ORDER — TRAMADOL HCL 50 MG PO TABS: 50.0000 mg | ORAL_TABLET | Freq: Four times a day (QID) | ORAL | Status: DC | PRN

## 2015-05-09 NOTE — Progress Notes (Signed)
Patient: Brittany Acevedo Female    DOB: 1941/01/01   74 y.o.   MRN: [id number] Visit Date: 05/09/2015  Today's Provider: Lorie Phenix, MD   Chief Complaint  Patient presents with  . Follow-up    Fracture of One rib, Right side   Subjective:    HPI  Patient here to follow up on the Right Rib Fracture.Came to follow up fracture on 12/08/14.  Does still have pain at times. Also has cramps at times. Also has chest pain at times. A soreness.  Like a pressure. Not sure how to explain it. Does not last long. Happens maybe once or twice a week. Just there. Not a hard pain.   Holds  It at times.  Has not seen the cardiologist in a long time. Sees Dr. Darrold Junker.  Does need to have a nuclear test. Did not have it done.  Does not take nitro when has the pain. Does not last long enough.     Allergies  Allergen Reactions  . Darvon [Propoxyphene]     hives  . Gabapentin     Throat closes   Previous Medications   ACETAMINOPHEN (TYLENOL) 500 MG TABLET    Take 500 mg by mouth every 6 (six) hours as needed.   ALBUTEROL (PROVENTIL HFA;VENTOLIN HFA) 108 (90 BASE) MCG/ACT INHALER    Inhale 2 puffs into the lungs every 6 (six) hours as needed for wheezing or shortness of breath.   ASPIRIN 325 MG TABLET    Take 325 mg by mouth daily.   ATORVASTATIN (LIPITOR) 10 MG TABLET    Take by mouth.   BUPROPION (WELLBUTRIN SR) 100 MG 12 HR TABLET    Take by mouth.   CALCIUM-VITAMIN D (OSCAL) 250-125 MG-UNIT PER TABLET    Take 1 tablet by mouth daily.   CITALOPRAM (CELEXA) 20 MG TABLET    Take 20 mg by mouth daily.   CLONAZEPAM (KLONOPIN) 0.5 MG TABLET    Take 0.5 mg by mouth 2 (two) times daily.   CLOPIDOGREL (PLAVIX) 75 MG TABLET    Take 75 mg by mouth daily.   DONEPEZIL (ARICEPT) 5 MG TABLET    Take 5 mg by mouth at bedtime.   FERROUS SULFATE 325 (65 FE) MG TABLET    Take by mouth.   FLUTICASONE (FLONASE) 50 MCG/ACT NASAL SPRAY    Place into the nose.   ISOSORBIDE DINITRATE (ISORDIL) 30 MG TABLET    Take  30 mg by mouth daily.   ISOSORBIDE MONONITRATE (IMDUR) 30 MG 24 HR TABLET    Take by mouth.   METOPROLOL SUCCINATE (TOPROL-XL) 25 MG 24 HR TABLET    Take 25 mg by mouth 2 (two) times daily.   METOPROLOL TARTRATE (LOPRESSOR) 25 MG TABLET    Take by mouth.   NITROGLYCERIN (NITROSTAT) 0.4 MG SL TABLET    Place under the tongue.   OMEGA-3 FATTY ACIDS (FISH OIL) 1000 MG CAPS    Take by mouth.   PANTOPRAZOLE (PROTONIX) 40 MG TABLET    Take 40 mg by mouth daily.   TRAMADOL (ULTRAM) 50 MG TABLET    Take 1 tablet (50 mg total) by mouth every 6 (six) hours as needed.   TRAZODONE (DESYREL) 50 MG TABLET    Take by mouth.    Review of Systems  Constitutional: Positive for fever (Had a fever on saturday).  HENT: Negative.   Eyes: Negative.   Respiratory: Negative.   Cardiovascular: Positive for chest  pain (having 2-3 times a week).  Gastrointestinal: Positive for abdominal pain and constipation.  Endocrine: Negative.   Genitourinary: Negative.   Musculoskeletal: Negative.        Still feels pain in the right Rib.  Skin: Negative.   Allergic/Immunologic: Positive for environmental allergies.       Seasonal  Neurological: Negative.   Hematological: Negative.   Psychiatric/Behavioral: Negative.     History  Substance Use Topics  . Smoking status: Current Every Day Smoker -- 1.00 packs/day for 55 years    Types: Cigarettes  . Smokeless tobacco: Never Used  . Alcohol Use: No   Objective:   BP 124/60 mmHg  Pulse 69  Temp(Src) 99.6 F (37.6 C) (Oral)  Resp 16  Wt 143 lb 9.6 oz (65.137 kg)  Physical Exam  Constitutional: She is oriented to person, place, and time. She appears well-developed and well-nourished.  Cardiovascular: Normal rate and regular rhythm.   Pulmonary/Chest: Effort normal. She has rales.  Abdominal: She exhibits distension. There is tenderness (bilaterlal lower quadrants. ).  Musculoskeletal: She exhibits tenderness (mild in abcomen but not really in low back or  buttock. ).  Neurological: She is alert and oriented to person, place, and time.  Psychiatric: She has a normal mood and affect. Her behavior is normal.      Assessment & Plan:     1. 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 Take nitro if symptoms last, call 911 if do not resolve, and follow with Dr. Darrold Junker.  - CBC with Differential/Platelet  2. Dementia, with behavioral disturbance Stable. Did not bring meds today. Will bring next time.   3. Hypercholesteremia Condition is stable. Please continue current medication and  plan of care as noted.  Will check labs.  - Lipid panel - Comprehensive metabolic panel  4. Pain in rib Refill medication to be used as needed.  - traMADol (ULTRAM) 50 MG tablet; Take 1 tablet (50 mg total) by mouth every 6 (six) hours as needed.  Dispense: 120 tablet; Refill: 1  5. Hyperglycemia Will check labs.   6. Pelvic pain in female Will check Xrays. Family concerned about old fracture from falls.  - DG Pelvis 1-2 Views; Future - DG Lumbar Spine Complete; Future - Urinalysis, Routine w reflex microscopic - Urine Culture - traMADol (ULTRAM) 50 MG tablet; Take 1 tablet (50 mg total) by mouth every 6 (six) hours as needed.  Dispense: 120 tablet; Refill: 1  7. Constipation, unspecified constipation type Suspect cause of lower abdominal pain.  Will try Mag Citrate and then daily medication. Follow up in two weeks to reassess.       Lorie Phenix, MD  [facility]

## 2015-05-10 ENCOUNTER — Telehealth: Payer: Self-pay | Admitting: Family Medicine

## 2015-05-10 MED ORDER — ALBUTEROL SULFATE HFA 108 (90 BASE) MCG/ACT IN AERS: 2.0000 | INHALATION_SPRAY | Freq: Four times a day (QID) | RESPIRATORY_TRACT | Status: DC | PRN

## 2015-05-10 NOTE — Telephone Encounter (Signed)
Pt contacted office for refill request on the following medications: albuterol (PROVENTIL HFA;VENTOLIN HFA) 108 (90 BASE) MCG/ACT inhaler to [facility]. Pt stated that she was in the office and this was supposed to be called in but the pharmacy told pt the didn't get the RX. Thanks TNP

## 2015-05-11 ENCOUNTER — Ambulatory Visit
Admission: RE | Admit: 2015-05-11 | Discharge: 2015-05-11 | Disposition: A | Discharge status: Home / Self Care | Payer: Medicare Other | Source: Ambulatory Visit | Attending: Family Medicine | Admitting: Family Medicine

## 2015-05-11 DIAGNOSIS — R102 Pelvic and perineal pain: Secondary | ICD-10-CM | POA: Diagnosis present

## 2015-05-12 ENCOUNTER — Telehealth: Payer: Self-pay

## 2015-05-12 DIAGNOSIS — I25708 Atherosclerosis of coronary artery bypass graft(s), unspecified, with other forms of angina pectoris: Secondary | ICD-10-CM

## 2015-05-12 LAB — CBC WITH DIFFERENTIAL/PLATELET
BASOS ABS: 0.1 10*3/uL (ref 0.0–0.2)
Basos: 1 %
EOS (ABSOLUTE): 0.3 10*3/uL (ref 0.0–0.4)
Eos: 3 %
HEMATOCRIT: 35.5 % (ref 34.0–46.6)
Hemoglobin: 11.4 g/dL (ref 11.1–15.9)
IMMATURE GRANS (ABS): 0 10*3/uL (ref 0.0–0.1)
IMMATURE GRANULOCYTES: 0 %
Lymphocytes Absolute: 3.3 10*3/uL — ABNORMAL HIGH (ref 0.7–3.1)
Lymphs: 34 %
MCH: 27.5 pg (ref 26.6–33.0)
MCHC: 32.1 g/dL (ref 31.5–35.7)
MCV: 86 fL (ref 79–97)
MONOS ABS: 1 10*3/uL — AB (ref 0.1–0.9)
Monocytes: 10 %
Neutrophils Absolute: 5 10*3/uL (ref 1.4–7.0)
Neutrophils: 52 %
PLATELETS: 357 10*3/uL (ref 150–379)
RBC: 4.15 x10E6/uL (ref 3.77–5.28)
RDW: 16.2 % — ABNORMAL HIGH (ref 12.3–15.4)
WBC: 9.6 10*3/uL (ref 3.4–10.8)

## 2015-05-12 LAB — COMPREHENSIVE METABOLIC PANEL
ALT: 14 IU/L (ref 0–32)
AST: 23 IU/L (ref 0–40)
Albumin/Globulin Ratio: 1.2 (ref 1.1–2.5)
Albumin: 3.8 g/dL (ref 3.5–4.8)
Alkaline Phosphatase: 56 IU/L (ref 39–117)
BUN/Creatinine Ratio: 14 (ref 11–26)
BUN: 9 mg/dL (ref 8–27)
Bilirubin Total: 0.3 mg/dL (ref 0.0–1.2)
CO2: 26 mmol/L (ref 18–29)
Calcium: 9 mg/dL (ref 8.7–10.3)
Chloride: 98 mmol/L (ref 97–108)
Creatinine, Ser: 0.66 mg/dL (ref 0.57–1.00)
GFR calc non Af Amer: 87 mL/min/{1.73_m2} (ref >59)
GFR, EST AFRICAN AMERICAN: 101 mL/min/{1.73_m2} (ref >59)
Globulin, Total: 3.2 g/dL (ref 1.5–4.5)
Glucose: 97 mg/dL (ref 65–99)
Potassium: 4.3 mmol/L (ref 3.5–5.2)
Sodium: 139 mmol/L (ref 134–144)
Total Protein: 7 g/dL (ref 6.0–8.5)

## 2015-05-12 LAB — LIPID PANEL
CHOLESTEROL TOTAL: 209 mg/dL — AB (ref 100–199)
Chol/HDL Ratio: 5.1 ratio units — ABNORMAL HIGH (ref 0.0–4.4)
HDL: 41 mg/dL (ref >39)
LDL Calculated: 151 mg/dL — ABNORMAL HIGH (ref 0–99)
Triglycerides: 84 mg/dL (ref 0–149)
VLDL Cholesterol Cal: 17 mg/dL (ref 5–40)

## 2015-05-12 LAB — URINALYSIS, ROUTINE W REFLEX MICROSCOPIC
Bilirubin, UA: NEGATIVE
Glucose, UA: NEGATIVE
Ketones, UA: NEGATIVE
Leukocytes, UA: NEGATIVE
Nitrite, UA: NEGATIVE
RBC, UA: NEGATIVE
Specific Gravity, UA: 1.018 (ref 1.005–1.030)
UUROB: 1 mg/dL (ref 0.2–1.0)
pH, UA: 6 (ref 5.0–7.5)

## 2015-05-12 NOTE — Telephone Encounter (Signed)
-----   Message from Lorie Phenix, MD sent at 05/11/2015  7:51 PM EDT ----- Xrays show some degenerative joint disease.   No fracture. Please see how patient is doing. Thanks.

## 2015-05-12 NOTE — Telephone Encounter (Signed)
-----   Message from Lorie Phenix, MD sent at 05/12/2015  2:00 PM EDT ----- Labs stable. Please notify patient.  Thanks.

## 2015-05-12 NOTE — Telephone Encounter (Signed)
Tried calling, pt VM is not set up.  05/12/2015  Thanks,   -Vernona Rieger

## 2015-05-12 NOTE — Telephone Encounter (Signed)
Pt advised.   Thanks,   -Laura  

## 2015-05-13 LAB — URINE CULTURE: ORGANISM ID, BACTERIA: NO GROWTH

## 2015-05-15 ENCOUNTER — Telehealth: Payer: Self-pay

## 2015-05-15 ENCOUNTER — Other Ambulatory Visit: Payer: Self-pay

## 2015-05-15 DIAGNOSIS — F419 Anxiety disorder, unspecified: Secondary | ICD-10-CM

## 2015-05-15 MED ORDER — ISOSORBIDE MONONITRATE ER 30 MG PO TB24: 30.0000 mg | ORAL_TABLET | Freq: Every day | ORAL | Status: DC

## 2015-05-15 MED ORDER — CLONAZEPAM 0.5 MG PO TABS: 0.5000 mg | ORAL_TABLET | Freq: Two times a day (BID) | ORAL | Status: DC

## 2015-05-15 NOTE — Telephone Encounter (Signed)
Please see why she is calling back.  Thanks.

## 2015-05-15 NOTE — Telephone Encounter (Signed)
Called ClonazePAM (KLONOPIN) 0.5 MG tablet  Take 1 tablet (0.5 mg total) by mouth 2 (two) times daily. Qty:60 Refill:5 to [place].  Thanks,  -Joseline

## 2015-05-15 NOTE — Telephone Encounter (Signed)
Advised pt of above results, pt verbalized fully understanding of results and stated that she is still hurting on her right side where she broke her rib.

## 2015-05-15 NOTE — Telephone Encounter (Signed)
-----   Message from Lorie Phenix, MD sent at 05/13/2015 12:33 PM EDT ----- No infection. Please see how patient is doing. Thanks.

## 2015-05-15 NOTE — Telephone Encounter (Signed)
Pt is requesting refill of Isosorbide. Allene Dillon, CMA

## 2015-05-15 NOTE — Telephone Encounter (Signed)
Ok to call in rx.  Thanks.  

## 2015-05-15 NOTE — Telephone Encounter (Signed)
Pt is returning call.  CB#[phone]/MW

## 2015-05-23 ENCOUNTER — Ambulatory Visit: Payer: Self-pay | Admitting: Family Medicine

## 2015-06-20 ENCOUNTER — Other Ambulatory Visit: Payer: Self-pay

## 2015-06-20 DIAGNOSIS — F0391 Unspecified dementia with behavioral disturbance: Secondary | ICD-10-CM

## 2015-06-20 MED ORDER — DONEPEZIL HCL 5 MG PO TABS: 5.0000 mg | ORAL_TABLET | Freq: Every day | ORAL | Status: DC

## 2015-06-20 NOTE — Telephone Encounter (Signed)
Last OV 12/12/2014   Thanks,   -Vernona Rieger

## 2015-07-26 ENCOUNTER — Other Ambulatory Visit: Payer: Self-pay | Admitting: Family Medicine

## 2015-07-26 ENCOUNTER — Other Ambulatory Visit: Payer: Self-pay

## 2015-07-26 DIAGNOSIS — I25708 Atherosclerosis of coronary artery bypass graft(s), unspecified, with other forms of angina pectoris: Secondary | ICD-10-CM

## 2015-07-26 MED ORDER — METOPROLOL TARTRATE 25 MG PO TABS: 12.5000 mg | ORAL_TABLET | Freq: Two times a day (BID) | ORAL | Status: DC

## 2015-07-26 NOTE — Telephone Encounter (Signed)
Last OV 11/2014  Thanks,   -Janette Harvie  

## 2015-08-17 ENCOUNTER — Other Ambulatory Visit: Payer: Self-pay

## 2015-08-17 DIAGNOSIS — R102 Pelvic and perineal pain: Secondary | ICD-10-CM

## 2015-08-17 DIAGNOSIS — R0781 Pleurodynia: Secondary | ICD-10-CM

## 2015-08-17 MED ORDER — TRAMADOL HCL 50 MG PO TABS
50.0000 mg | ORAL_TABLET | Freq: Four times a day (QID) | ORAL | Status: DC | PRN
Start: 2015-08-17 — End: 2015-10-12

## 2015-08-17 NOTE — Telephone Encounter (Signed)
Rx called [address]. 

## 2015-08-17 NOTE — Telephone Encounter (Signed)
Pharmacy requesting refill. Patient's last OV was 05/09/15. Last refilled on 05/09/15 with 1 refill.

## 2015-08-17 NOTE — Telephone Encounter (Signed)
Printed, please fax or call in to pharmacy. Thank you.   

## 2015-09-05 ENCOUNTER — Other Ambulatory Visit: Payer: Self-pay | Admitting: Family Medicine

## 2015-09-05 DIAGNOSIS — F329 Major depressive disorder, single episode, unspecified: Secondary | ICD-10-CM

## 2015-09-05 DIAGNOSIS — F32A Depression, unspecified: Secondary | ICD-10-CM

## 2015-10-02 ENCOUNTER — Other Ambulatory Visit: Payer: Self-pay | Admitting: Family Medicine

## 2015-10-02 DIAGNOSIS — I25708 Atherosclerosis of coronary artery bypass graft(s), unspecified, with other forms of angina pectoris: Secondary | ICD-10-CM

## 2015-10-06 ENCOUNTER — Other Ambulatory Visit: Payer: Self-pay

## 2015-10-06 DIAGNOSIS — K219 Gastro-esophageal reflux disease without esophagitis: Secondary | ICD-10-CM

## 2015-10-06 MED ORDER — PANTOPRAZOLE SODIUM 40 MG PO TBEC: 40.0000 mg | DELAYED_RELEASE_TABLET | Freq: Every day | ORAL | Status: DC

## 2015-10-12 ENCOUNTER — Other Ambulatory Visit: Payer: Self-pay

## 2015-10-12 DIAGNOSIS — R102 Pelvic and perineal pain: Secondary | ICD-10-CM

## 2015-10-12 DIAGNOSIS — R0781 Pleurodynia: Secondary | ICD-10-CM

## 2015-10-12 MED ORDER — TRAMADOL HCL 50 MG PO TABS: 50.0000 mg | ORAL_TABLET | Freq: Four times a day (QID) | ORAL | Status: DC | PRN

## 2015-10-12 NOTE — Telephone Encounter (Signed)
Printed, please fax or call in to pharmacy. Thank you.   

## 2015-10-12 NOTE — Telephone Encounter (Signed)
Last ov 05/09/15

## 2015-11-05 ENCOUNTER — Emergency Department
Admission: EM | Admit: 2015-11-05 | Discharge: 2015-11-05 | Disposition: A | Discharge status: Home / Self Care | Payer: Medicare Other | Attending: Student | Admitting: Student

## 2015-11-05 ENCOUNTER — Encounter: Payer: Self-pay | Admitting: Emergency Medicine

## 2015-11-05 ENCOUNTER — Emergency Department: Payer: Medicare Other

## 2015-11-05 DIAGNOSIS — W000XXA Fall on same level due to ice and snow, initial encounter: Secondary | ICD-10-CM | POA: Insufficient documentation

## 2015-11-05 DIAGNOSIS — I10 Essential (primary) hypertension: Secondary | ICD-10-CM | POA: Insufficient documentation

## 2015-11-05 DIAGNOSIS — Y998 Other external cause status: Secondary | ICD-10-CM | POA: Insufficient documentation

## 2015-11-05 DIAGNOSIS — S8262XA Displaced fracture of lateral malleolus of left fibula, initial encounter for closed fracture: Secondary | ICD-10-CM | POA: Diagnosis not present

## 2015-11-05 DIAGNOSIS — Y9389 Activity, other specified: Secondary | ICD-10-CM | POA: Insufficient documentation

## 2015-11-05 DIAGNOSIS — S8265XA Nondisplaced fracture of lateral malleolus of left fibula, initial encounter for closed fracture: Secondary | ICD-10-CM | POA: Insufficient documentation

## 2015-11-05 DIAGNOSIS — Z7951 Long term (current) use of inhaled steroids: Secondary | ICD-10-CM | POA: Insufficient documentation

## 2015-11-05 DIAGNOSIS — Y9289 Other specified places as the place of occurrence of the external cause: Secondary | ICD-10-CM | POA: Diagnosis not present

## 2015-11-05 DIAGNOSIS — F17210 Nicotine dependence, cigarettes, uncomplicated: Secondary | ICD-10-CM | POA: Insufficient documentation

## 2015-11-05 DIAGNOSIS — Z79899 Other long term (current) drug therapy: Secondary | ICD-10-CM | POA: Diagnosis not present

## 2015-11-05 DIAGNOSIS — Z7982 Long term (current) use of aspirin: Secondary | ICD-10-CM | POA: Insufficient documentation

## 2015-11-05 DIAGNOSIS — S99912A Unspecified injury of left ankle, initial encounter: Secondary | ICD-10-CM | POA: Diagnosis present

## 2015-11-05 MED ORDER — TRAMADOL HCL 50 MG PO TABS: 50.0000 mg | ORAL_TABLET | Freq: Four times a day (QID) | ORAL | Status: DC | PRN

## 2015-11-05 NOTE — Discharge Instructions (Signed)
Ankle Fracture A fracture is a break in a bone. The ankle joint is made up of three bones. These include the lower (distal)sections of your lower leg bones, called the tibia and fibula, along with a bone in your foot, called the talus. Depending on how bad the break is and if more than one ankle joint bone is broken, a cast or splint is used to protect and keep your injured bone from moving while it heals. Sometimes, surgery is required to help the fracture heal properly.  There are two general types of fractures:  Stable fracture. This includes a single fracture line through one bone, with no injury to ankle ligaments. A fracture of the talus that does not have any displacement (movement of the bone on either side of the fracture line) is also stable.  Unstable fracture. This includes more than one fracture line through one or more bones in the ankle joint. It also includes fractures that have displacement of the bone on either side of the fracture line. CAUSES  A direct blow to the ankle.   Quickly and severely twisting your ankle.  Trauma, such as a car accident or falling from a significant height. RISK FACTORS You may be at a higher risk of ankle fracture if:  You have certain medical conditions.  You are involved in high-impact sports.  You are involved in a high-impact car accident. SIGNS AND SYMPTOMS   Tender and swollen ankle.  Bruising around the injured ankle.  Pain on movement of the ankle.  Difficulty walking or putting weight on the ankle.  A cold foot below the site of the ankle injury. This can occur if the blood vessels passing through your injured ankle were also damaged.  Numbness in the foot below the site of the ankle injury. DIAGNOSIS  An ankle fracture is usually diagnosed with a physical exam and X-rays. A CT scan may also be required for complex fractures. TREATMENT  Stable fractures are treated with a cast or splint and using crutches to avoid putting  weight on your injured ankle. This is followed by an ankle strengthening program. Some patients require a special type of cast, depending on other medical problems they may have. Unstable fractures require surgery to ensure the bones heal properly. Your health care provider will tell you what type of fracture you have and the best treatment for your condition. HOME CARE INSTRUCTIONS   Review correct crutch use with your health care provider and use your crutches as directed. Safe use of crutches is extremely important. Misuse of crutches can cause you to fall or cause injury to nerves in your hands or armpits.  Do not put weight or pressure on the injured ankle until directed by your health care provider.  To lessen the swelling, keep the injured leg elevated while sitting or lying down.  Apply ice to the injured area:  Put ice in a plastic bag.  Place a towel between your cast and the bag.  Leave the ice on for 20 minutes, 2-3 times a day.  If you have a plaster or fiberglass cast:  Do not try to scratch the skin under the cast with any objects. This can increase your risk of skin infection.  Check the skin around the cast every day. You may put lotion on any red or sore areas.  Keep your cast dry and clean.  If you have a plaster splint:  Wear the splint as directed.  You may loosen the elastic   around the splint if your toes become numb, tingle, or turn cold or blue.  Do not put pressure on any part of your cast or splint; it may break. Rest your cast only on a pillow the first 24 hours until it is fully hardened.  Your cast or splint can be protected during bathing with a plastic bag sealed to your skin with medical tape. Do not lower the cast or splint into water.  Take medicines as directed by your health care provider. Only take over-the-counter or prescription medicines for pain, discomfort, or fever as directed by your health care provider.  Do not drive a vehicle until  your health care provider specifically tells you it is safe to do so.  If your health care provider has given you a follow-up appointment, it is very important to keep that appointment. Not keeping the appointment could result in a chronic or permanent injury, pain, and disability. If you have any problem keeping the appointment, call the facility for assistance. SEEK MEDICAL CARE IF: You develop increased swelling or discomfort. SEEK IMMEDIATE MEDICAL CARE IF:   Your cast gets damaged or breaks.  You have continued severe pain.  You develop new pain or swelling after the cast was put on.  Your skin or toenails below the injury turn blue or gray.  Your skin or toenails below the injury feel cold, numb, or have loss of sensitivity to touch.  There is a bad smell or pus draining from under the cast. MAKE SURE YOU:   Understand these instructions.  Will watch your condition.  Will get help right away if you are not doing well or get worse.   This information is not intended to replace advice given to you by your health care provider. Make sure you discuss any questions you have with your health care provider.   Document Released: 09/27/2000 Document Revised: 10/05/2013 Document Reviewed: 04/29/2013 Elsevier Interactive Patient Education 2016 Elsevier Inc.  

## 2015-11-05 NOTE — ED Provider Notes (Signed)
[facility] Emergency Department Provider Note ____________________________________________  Time seen: Approximately 2:06 PM  I have reviewed the triage vital signs and the nursing notes.   HISTORY  Chief Complaint Ankle Pain   HPI THETIS SPARLING is a 75 y.o. female who presents to the emergency department for evaluation of left ankle pain. She fell on the ice on October 22, 2015. She has tried to treat it at home with ice, elevation, and an ACE wrap without relief. She states the swelling continues and the pain is not improving. She takes Tramadol with some relief if she adds tylenol with it.   Past Medical History  Diagnosis Date  . Hypertension   . Heart attack (HCC) 2013  . Asthma   . Emphysema lung (HCC)   . Sinus trouble   . Peripheral nervous system disorder [facility])     Patient Active Problem List   Diagnosis Date Noted  . Hyperglycemia 05/09/2015  . Pelvic pain in female 05/09/2015  . Constipation 05/09/2015  . Anxiety 04/22/2015  . Autoimmune disease (HCC) 04/22/2015  . Body mass index (BMI) of 28.0-28.9 in adult 04/22/2015  . Atherosclerosis of coronary artery 04/22/2015  . Chronic headache 04/22/2015  . Dementia 04/22/2015  . Clinical depression 04/22/2015  . Dizziness 04/22/2015  . Family history of diabetes mellitus 04/22/2015  . Broken rib 04/22/2015  . Acid reflux 04/22/2015  . Cannot sleep 04/22/2015  . Amnesia 04/22/2015  . Cramp in muscle 04/22/2015  . Excessive urination at night 04/22/2015  . Decreased potassium in the blood 04/22/2015  . Excessive falling 04/22/2015  . Pain in rib 04/22/2015  . History of biliary T-tube placement 04/22/2015  . Anemia 05/02/2014  . Dyspnea 04/11/2014  . Tobacco abuse 04/11/2014  . 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 Fibrositis 03/16/2014  . Heart attack (HCC) 03/16/2014  . Hypercholesteremia 04/15/2006    Past Surgical History  Procedure Laterality  Date  . Angioplasty  2013  . Partial hysterectomy  1980  . Tubal ligation  1970  . Cyst removed from face  2008  . Appendectomy    . Abdominal hysterectomy      w/ removal of ovarian cyst    Current Outpatient Rx  Name  Route  Sig  Dispense  Refill  . acetaminophen (TYLENOL) 500 MG tablet   Oral   Take 500 mg by mouth every 6 (six) hours as needed.         Marland Kitchen albuterol (PROVENTIL HFA;VENTOLIN HFA) 108 (90 BASE) MCG/ACT inhaler   Inhalation   Inhale 2 puffs into the lungs every 6 (six) hours as needed for wheezing or shortness of breath.   18 g   3   . aspirin 325 MG tablet   Oral   Take 325 mg by mouth daily.         Marland Kitchen atorvastatin (LIPITOR) 10 MG tablet   Oral   Take by mouth.         Marland Kitchen buPROPion (WELLBUTRIN SR) 100 MG 12 hr tablet   Oral   Take by mouth.         . calcium-vitamin D (OSCAL) 250-125 MG-UNIT per tablet   Oral   Take 1 tablet by mouth daily.         . citalopram (CELEXA) 20 MG tablet      1 Tablet, Oral, daily   30 tablet   5   . clonazePAM (KLONOPIN) 0.5 MG tablet  Oral   Take 1 tablet (0.5 mg total) by mouth 2 (two) times daily.   60 tablet   5   . clopidogrel (PLAVIX) 75 MG tablet      1 Tablet, Oral, daily, by mouth   90 tablet   1   . donepezil (ARICEPT) 5 MG tablet   Oral   Take 1 tablet (5 mg total) by mouth at bedtime.   90 tablet   3   . ferrous sulfate 325 (65 FE) MG tablet   Oral   Take by mouth.         . fluticasone (FLONASE) 50 MCG/ACT nasal spray   Nasal   Place into the nose.         . isosorbide dinitrate (ISORDIL) 30 MG tablet   Oral   Take 30 mg by mouth daily.         . isosorbide mononitrate (IMDUR) 30 MG 24 hr tablet   Oral   Take 1 tablet (30 mg total) by mouth daily.   30 tablet   5   . magnesium citrate SOLN   Oral   Take 148 mLs (0.5 Bottles total) by mouth once. And repeat in 6 hours if needed.   195 mL   1   . metoprolol succinate (TOPROL-XL) 25 MG 24 hr tablet   Oral    Take 25 mg by mouth 2 (two) times daily.         . metoprolol tartrate (LOPRESSOR) 25 MG tablet   Oral   Take 0.5 tablets (12.5 mg total) by mouth 2 (two) times daily.   30 tablet   3   . nitroGLYCERIN (NITROSTAT) 0.4 MG SL tablet   Sublingual   Place under the tongue.         . Omega-3 Fatty Acids (FISH OIL) 1000 MG CAPS   Oral   Take by mouth.         . pantoprazole (PROTONIX) 40 MG tablet   Oral   Take 1 tablet (40 mg total) by mouth daily.   90 tablet   1   . traMADol (ULTRAM) 50 MG tablet   Oral   Take 1 tablet (50 mg total) by mouth every 6 (six) hours as needed.   12 tablet   0   . traZODone (DESYREL) 50 MG tablet   Oral   Take by mouth.           Allergies Darvon and Gabapentin  Family History  Problem Relation Age of Onset  . Emphysema Cousin   . Asthma Sister   . Asthma Other   . Heart disease Father   . Rheum arthritis Father   . Heart disease Maternal Uncle   . Heart disease Maternal Grandmother   . Clotting disorder Sister   . Rheum arthritis Son   . Rheum arthritis Brother   . Rheum arthritis Sister   . Cancer Other     Social History Social History  Substance Use Topics  . Smoking status: Current Every Day Smoker -- 1.00 packs/day for 55 years    Types: Cigarettes  . Smokeless tobacco: Never Used  . Alcohol Use: No    Review of Systems Constitutional: No recent illness. Eyes: No visual changes. ENT: No sore throat. Cardiovascular: Denies chest pain or palpitations. Respiratory: Denies shortness of breath. Gastrointestinal: No abdominal pain.  Genitourinary: Negative for dysuria. Musculoskeletal: Pain in left ankle. Skin: Negative for rash. Neurological: Negative for headaches, focal weakness or numbness.  10-point ROS otherwise negative.  ____________________________________________   PHYSICAL EXAM:  VITAL SIGNS: ED Triage Vitals  Enc Vitals Group     BP 11/05/15 1319 125/66 mmHg     Pulse Rate 11/05/15 1319 70      Resp 11/05/15 1319 18     Temp 11/05/15 1319 98.9 F (37.2 C)     Temp src --      SpO2 11/05/15 1319 98 %     Weight 11/05/15 1319 140 lb (63.504 kg)     Height 11/05/15 1319 5' (1.524 m)     Head Cir --      Peak Flow --      Pain Score 11/05/15 1320 5     Pain Loc --      Pain Edu? --      Excl. in GC? --     Constitutional: Alert and oriented. Well appearing and in no acute distress. Eyes: Conjunctivae are normal. EOMI. Head: Atraumatic. Nose: No congestion/rhinnorhea. Neck: No stridor.  Respiratory: Normal respiratory effort.   Musculoskeletal: Tenderness focal over the lateral malleolus with diffuse swelling of the ankle and foot.  Neurologic:  Normal speech and language. No gross focal neurologic deficits are appreciated. Speech is normal. No gait instability. Skin:  Skin is warm, dry and intact. Atraumatic. Psychiatric: Mood and affect are normal. Speech and behavior are normal.  ____________________________________________   LABS (all labs ordered are listed, but only abnormal results are displayed)  Labs Reviewed - No data to display ____________________________________________  RADIOLOGY  Healing, nondisplaced lateral malleolus fracture. (Left)  I, Shavon Zenz, personally viewed and evaluated these images (plain radiographs) as part of my medical decision making, as well as reviewing the written report by the radiologist.  ____________________________________________   PROCEDURES  Procedure(s) performed:   Ankle stirrup splint applied by ER tech. Neurovascularly intact post application.  I, Kem Boroughs, personally viewed and evaluated these images (plain radiographs) as part of my medical decision making, as well as reviewing the written report by the radiologist.    ____________________________________________   INITIAL IMPRESSION / ASSESSMENT AND PLAN / ED COURSE  Pertinent labs & imaging results that were available during my care of the  patient were reviewed by me and considered in my medical decision making (see chart for details).  Patient was advised to follow up with orthopedics for symptoms that are not improving over the next 7-14 days. She was  also advised to return to the ER for symptoms that change or worsen if unable to schedule an appointment. She will take Tramadol and tylenol for pain.  ____________________________________________   FINAL CLINICAL IMPRESSION(S) / ED DIAGNOSES  Final diagnoses:  Fractured lateral malleolus, left, closed, initial encounter       Chinita Pester, FNP 11/05/15 1612  Gayla Doss, MD 11/05/15 [phone]

## 2015-11-05 NOTE — ED Notes (Addendum)
Pt reports she fell in the snow over a week ago. The pain has been slowly increasing. Left ankle was significantly bruised when she first fell, but the bruising has subsided now. Pt states she is able to walk on it, but there is pain.  Pt states she has been taking pain medication and using a cane but it has not been helping. Patient takes Tramadol 50mg  Q6H last dose was at 12pm today.  Patient takes Tramadol for fibromyalgia. Pt has also been taking Tylenol ES and regular strength tylenol one tab of each kind.

## 2015-11-05 NOTE — ED Notes (Signed)
Pt states she was walking in the snow to get to her car when she slipped on ice. Pt c/o L ankle pain. Swelling and bruising noted at this time.

## 2015-11-07 ENCOUNTER — Other Ambulatory Visit: Payer: Self-pay

## 2015-11-07 DIAGNOSIS — F419 Anxiety disorder, unspecified: Secondary | ICD-10-CM

## 2015-11-07 MED ORDER — CLONAZEPAM 0.5 MG PO TABS
0.5000 mg | ORAL_TABLET | Freq: Two times a day (BID) | ORAL | Status: DC
Start: 2015-11-07 — End: 2016-02-26

## 2015-11-07 NOTE — Telephone Encounter (Signed)
Must be seen by dr Elease Hashimoto for further refills

## 2015-11-13 ENCOUNTER — Other Ambulatory Visit: Payer: Self-pay | Admitting: Family Medicine

## 2015-11-21 DIAGNOSIS — M25372 Other instability, left ankle: Secondary | ICD-10-CM | POA: Diagnosis not present

## 2015-11-21 DIAGNOSIS — M25572 Pain in left ankle and joints of left foot: Secondary | ICD-10-CM | POA: Diagnosis not present

## 2015-11-21 DIAGNOSIS — S82832D Other fracture of upper and lower end of left fibula, subsequent encounter for closed fracture with routine healing: Secondary | ICD-10-CM | POA: Diagnosis not present

## 2015-12-05 ENCOUNTER — Other Ambulatory Visit: Payer: Self-pay

## 2015-12-05 MED ORDER — TRAMADOL HCL 50 MG PO TABS: 50.0000 mg | ORAL_TABLET | Freq: Four times a day (QID) | ORAL | Status: DC | PRN

## 2015-12-05 NOTE — Telephone Encounter (Signed)
Printed, please fax or call in to pharmacy. Thank you.   

## 2015-12-19 DIAGNOSIS — M25572 Pain in left ankle and joints of left foot: Secondary | ICD-10-CM | POA: Diagnosis not present

## 2015-12-19 DIAGNOSIS — S82832D Other fracture of upper and lower end of left fibula, subsequent encounter for closed fracture with routine healing: Secondary | ICD-10-CM | POA: Diagnosis not present

## 2015-12-25 ENCOUNTER — Other Ambulatory Visit: Payer: Self-pay | Admitting: Family Medicine

## 2016-01-02 ENCOUNTER — Other Ambulatory Visit: Payer: Self-pay

## 2016-01-02 MED ORDER — TRAMADOL HCL 50 MG PO TABS: 50.0000 mg | ORAL_TABLET | Freq: Four times a day (QID) | ORAL | Status: DC | PRN

## 2016-01-02 NOTE — Telephone Encounter (Signed)
Printed, please fax or call in to pharmacy. Thank you.   

## 2016-01-11 ENCOUNTER — Other Ambulatory Visit: Payer: Self-pay | Admitting: Family Medicine

## 2016-01-11 DIAGNOSIS — E7800 Pure hypercholesterolemia, unspecified: Secondary | ICD-10-CM

## 2016-02-26 ENCOUNTER — Other Ambulatory Visit: Payer: Self-pay

## 2016-02-26 DIAGNOSIS — F419 Anxiety disorder, unspecified: Secondary | ICD-10-CM

## 2016-02-26 MED ORDER — CLONAZEPAM 0.5 MG PO TABS: 0.5000 mg | ORAL_TABLET | Freq: Two times a day (BID) | ORAL | Status: DC

## 2016-02-26 NOTE — Telephone Encounter (Signed)
Printed, please fax or call in to pharmacy. Thank you.   

## 2016-03-21 ENCOUNTER — Other Ambulatory Visit: Payer: Self-pay | Admitting: Family Medicine

## 2016-03-21 DIAGNOSIS — K219 Gastro-esophageal reflux disease without esophagitis: Secondary | ICD-10-CM

## 2016-03-21 DIAGNOSIS — I25708 Atherosclerosis of coronary artery bypass graft(s), unspecified, with other forms of angina pectoris: Secondary | ICD-10-CM

## 2016-04-05 ENCOUNTER — Ambulatory Visit: Payer: Self-pay | Admitting: Family Medicine

## 2016-04-12 ENCOUNTER — Telehealth: Payer: Self-pay

## 2016-04-12 NOTE — Telephone Encounter (Signed)
She can either follow up with Antony Contras or Dr. Sherrie Mustache.  LMTCB 04/12/2016.  Thanks,   -Vernona Rieger

## 2016-04-12 NOTE — Telephone Encounter (Signed)
A friend of the patient, Karoline Caldwell, called ofr patient because patient was not sure how to go about this, patient needs to be seen for follow up and she was askign if Dr. Sullivan Lone would see her. I advised her that i was not sure who she suppose to be following with since Dr.Gilbert not taking a lot of patients. I told her I would check with you and see if you knew what Dr. Elease Hashimoto would want to do. Please let Angie know who patient to follow up with, last visit in July [phone]

## 2016-04-17 NOTE — Telephone Encounter (Signed)
Patient has an appt on 04/23/2016 with dr Sullivan Lone. sd

## 2016-04-23 ENCOUNTER — Ambulatory Visit: Payer: Self-pay | Admitting: Family Medicine

## 2016-04-25 ENCOUNTER — Other Ambulatory Visit: Payer: Self-pay | Admitting: Family Medicine

## 2016-04-25 MED ORDER — TRAMADOL HCL 50 MG PO TABS: 50.0000 mg | ORAL_TABLET | Freq: Four times a day (QID) | ORAL | Status: DC | PRN

## 2016-04-25 NOTE — Telephone Encounter (Signed)
Please review. Thanks!  

## 2016-04-25 NOTE — Telephone Encounter (Signed)
Pt contacted office for refill request on the following medications:  traMADol (ULTRAM) 50 MG tablet.  [employer].  CB#[phone]/MW  This is a Dr Elease Hashimoto patient.

## 2016-04-29 ENCOUNTER — Telehealth: Payer: Self-pay

## 2016-04-29 NOTE — Telephone Encounter (Signed)
lmtcb-received message from call a nurse over the weekend where patient called and it looks like she need refill on Tramadol? She was out. Need to verify what the call was for sure-aa

## 2016-04-30 NOTE — Telephone Encounter (Signed)
Pt is been seen tomorrow on 05/01/16-aa

## 2016-05-01 ENCOUNTER — Ambulatory Visit: Payer: Self-pay | Admitting: Family Medicine

## 2016-05-03 ENCOUNTER — Telehealth: Payer: Self-pay | Admitting: Family Medicine

## 2016-05-03 NOTE — Telephone Encounter (Signed)
Pt calling requesting a refill on the following medication, pt states she is out and can not wait until her next appointment which is on July 26.   Pt uses [facility]Thanks [facility]    traMADol (ULTRAM) 50 MG tablet  Take 1 tablet (50 mg total) by mouth every 6 (six) hours as needed.

## 2016-05-04 NOTE — Telephone Encounter (Signed)
1 [id number]--[id number]

## 2016-05-06 NOTE — Telephone Encounter (Signed)
I already called this in for patient 1 week ago-aa

## 2016-05-08 ENCOUNTER — Ambulatory Visit (INDEPENDENT_AMBULATORY_CARE_PROVIDER_SITE_OTHER): Payer: Medicare Other | Admitting: Family Medicine

## 2016-05-08 ENCOUNTER — Encounter: Payer: Self-pay | Admitting: Family Medicine

## 2016-05-08 VITALS — BP 130/70 | HR 71 | Temp 99.2°F | Resp 16 | Wt 148.0 lb

## 2016-05-08 DIAGNOSIS — F329 Major depressive disorder, single episode, unspecified: Secondary | ICD-10-CM

## 2016-05-08 DIAGNOSIS — Z0000 Encounter for general adult medical examination without abnormal findings: Secondary | ICD-10-CM | POA: Diagnosis not present

## 2016-05-08 DIAGNOSIS — F0391 Unspecified dementia with behavioral disturbance: Secondary | ICD-10-CM

## 2016-05-08 DIAGNOSIS — F32A Depression, unspecified: Secondary | ICD-10-CM

## 2016-05-08 MED ORDER — ISOSORBIDE MONONITRATE ER 30 MG PO TB24: ORAL_TABLET | ORAL | 1 refills | Status: DC

## 2016-05-08 MED ORDER — DONEPEZIL HCL 5 MG PO TABS: 5.0000 mg | ORAL_TABLET | Freq: Every day | ORAL | 1 refills | Status: DC

## 2016-05-08 MED ORDER — METOPROLOL TARTRATE 25 MG PO TABS: ORAL_TABLET | ORAL | 1 refills | Status: DC

## 2016-05-08 MED ORDER — CITALOPRAM HYDROBROMIDE 20 MG PO TABS: ORAL_TABLET | ORAL | 5 refills | Status: DC

## 2016-05-08 NOTE — Patient Instructions (Signed)
For sinus symptoms drink plenty of fluids and take over-the-counter Robitussin.   Labs ordered TSH Lipids CBC with diff CMET Vitamin D  Follow-up for hypertension in 6 months

## 2016-05-08 NOTE — Progress Notes (Signed)
Patient: Brittany Acevedo Female    DOB: 04/13/1941   74 y.o.   MRN: [id number] Visit Date: 05/08/2016  Today's Provider: Megan Mans, MD   Chief Complaint  Patient presents with  . Follow-up   Subjective:    HPI     Hypertension, follow-up:  BP Readings from Last 3 Encounters:  05/08/16 130/70  11/05/15 125/66  05/09/15 124/60    She was last seen for hypertension 1 years ago.  Management since that visit includes; no changes. She reports good compliance with treatment. She is not having side effects. none She is not exercising. She is adherent to low salt diet.   Outside blood pressures are none. She is experiencing chest pain.  Patient denies none.   Cardiovascular risk factors include none.  Use of agents associated with hypertension: amphetamines.     Weight trend: stable Wt Readings from Last 3 Encounters:  05/08/16 148 lb (67.1 kg)  11/05/15 140 lb (63.5 kg)  05/09/15 143 lb 9.6 oz (65.1 kg)    Current diet: well balanced       Dementia, with behavioral disturbance: From 05/09/15-Stable. Did not bring meds today. Will bring next time.   Hypercholesteremia: From 05/09/15-Condition is stable. Please continue current medication and  plan of care as noted.  Will check labs. normal   Pain in rib: From 05/09/15-Refill medication to be used as needed.  - traMADol (ULTRAM) 50 MG tablet; Take 1 tablet (50 mg total) by mouth every 6 (six) hours as needed.   Hyperglycemia: From 05/09/15-Will check labs. normal  Pelvic pain in female: From 05/09/15-Will check Xrays. Family concerned about old fracture from falls.    Allergies  Allergen Reactions  . Darvon [Propoxyphene]     hives  . Gabapentin     Throat closes   Current Meds  Medication Sig  . acetaminophen (TYLENOL) 500 MG tablet Take 500 mg by mouth every 6 (six) hours as needed.  Marland Kitchen albuterol (PROVENTIL HFA;VENTOLIN HFA) 108 (90 BASE) MCG/ACT inhaler Inhale 2 puffs into the  lungs every 6 (six) hours as needed for wheezing or shortness of breath.  Marland Kitchen aspirin 325 MG tablet Take 325 mg by mouth daily.  Marland Kitchen buPROPion (WELLBUTRIN SR) 100 MG 12 hr tablet Take by mouth.  . citalopram (CELEXA) 20 MG tablet 1 Tablet, Oral, daily  . clonazePAM (KLONOPIN) 0.5 MG tablet Take 1 tablet (0.5 mg total) by mouth 2 (two) times daily.  . clopidogrel (PLAVIX) 75 MG tablet 1 Tablet, Oral, daily, by mouth  . donepezil (ARICEPT) 5 MG tablet Take 1 tablet (5 mg total) by mouth at bedtime.  . fluticasone (FLONASE) 50 MCG/ACT nasal spray Place into the nose.  . isosorbide mononitrate (IMDUR) 30 MG 24 hr tablet Take 1 tablet (30 mg total) by mouth daily.  . metoprolol tartrate (LOPRESSOR) 25 MG tablet Take 0.5 tablets (12.5 mg total) by mouth 2 (two) times daily.  . nitroGLYCERIN (NITROSTAT) 0.4 MG SL tablet Place under the tongue.  . Omega-3 Fatty Acids (FISH OIL) 1000 MG CAPS Take by mouth.  . pantoprazole (PROTONIX) 40 MG tablet Take 1 tablet (40 mg total) by mouth daily.  . traMADol (ULTRAM) 50 MG tablet Take 1 tablet (50 mg total) by mouth every 6 (six) hours as needed.  . traZODone (DESYREL) 50 MG tablet Take by mouth.  . [DISCONTINUED] isosorbide dinitrate (ISORDIL) 30 MG tablet Take 30 mg by mouth daily.    Review of Systems  Constitutional: Negative for appetite change, chills and fever.  Eyes: Negative.   Respiratory: Negative for chest tightness and shortness of breath.   Cardiovascular: Negative for chest pain and palpitations.  Gastrointestinal: Negative for abdominal pain, nausea and vomiting.  Endocrine: Negative.   Allergic/Immunologic: Negative.   Neurological: Negative for dizziness and weakness.    Social History  Substance Use Topics  . Smoking status: Current Every Day Smoker    Packs/day: 1.00    Years: 55.00    Types: Cigarettes  . Smokeless tobacco: Never Used  . Alcohol use No   Objective:   BP 130/70 (BP Location: Right Arm, Patient Position: Sitting,  Cuff Size: Normal)   Pulse 71   Temp 99.2 F (37.3 C) (Oral)   Resp 16   Wt 148 lb (67.1 kg)   SpO2 98%   BMI 28.90 kg/m   Physical Exam  Constitutional: She is oriented to person, place, and time. She appears well-developed and well-nourished.  HENT:  Head: Normocephalic and atraumatic.  Right Ear: External ear normal.  Left Ear: External ear normal.  Nose: Nose normal.  Eyes: Conjunctivae are normal.  Neck: Neck supple.  Cardiovascular: Normal rate, regular rhythm and normal heart sounds.   Pulmonary/Chest: Effort normal and breath sounds normal.  Abdominal: Soft.  Neurological: She is alert and oriented to person, place, and time.  Skin: Skin is warm and dry.  Psychiatric: She has a normal mood and affect. Her behavior is normal. Judgment and thought content normal.        Assessment & Plan:     1. Clinical depression More than 50%of visit spent in counselling. - citalopram (CELEXA) 20 MG tablet; 1 Tablet, Oral, daily  Dispense: 30 tablet; Refill: 5  2. Dementia, with behavioral disturbance  - donepezil (ARICEPT) 5 MG tablet; Take 1 tablet (5 mg total) by mouth at bedtime.  Dispense: 90 tablet; Refill: 1  3. Routine health maintenance  - CBC w/Diff/Platelet - Comprehensive Metabolic Panel (CMET) - Lipid panel - TSH - Vitamin D (25 hydroxy)       Megan Mans, MD  [facility]

## 2016-05-21 LAB — CBC WITH DIFFERENTIAL/PLATELET
BASOS: 1 %
Basophils Absolute: 0.1 10*3/uL (ref 0.0–0.2)
EOS (ABSOLUTE): 0.4 10*3/uL (ref 0.0–0.4)
EOS: 5 %
HEMATOCRIT: 32.4 % — AB (ref 34.0–46.6)
HEMOGLOBIN: 10.2 g/dL — AB (ref 11.1–15.9)
Immature Grans (Abs): 0 10*3/uL (ref 0.0–0.1)
Immature Granulocytes: 0 %
LYMPHS ABS: 2.7 10*3/uL (ref 0.7–3.1)
Lymphs: 29 %
MCH: 24.1 pg — ABNORMAL LOW (ref 26.6–33.0)
MCHC: 31.5 g/dL (ref 31.5–35.7)
MCV: 76 fL — AB (ref 79–97)
MONOCYTES: 10 %
MONOS ABS: 0.9 10*3/uL (ref 0.1–0.9)
NEUTROS ABS: 5.3 10*3/uL (ref 1.4–7.0)
Neutrophils: 55 %
Platelets: 455 10*3/uL — ABNORMAL HIGH (ref 150–379)
RBC: 4.24 x10E6/uL (ref 3.77–5.28)
RDW: 18.1 % — ABNORMAL HIGH (ref 12.3–15.4)
WBC: 9.3 10*3/uL (ref 3.4–10.8)

## 2016-05-21 LAB — COMPREHENSIVE METABOLIC PANEL
A/G RATIO: 1.3 (ref 1.2–2.2)
ALBUMIN: 3.9 g/dL (ref 3.5–4.8)
ALK PHOS: 45 IU/L (ref 39–117)
ALT: 22 IU/L (ref 0–32)
AST: 29 IU/L (ref 0–40)
BUN / CREAT RATIO: 14 (ref 12–28)
BUN: 9 mg/dL (ref 8–27)
Bilirubin Total: 0.2 mg/dL (ref 0.0–1.2)
CO2: 25 mmol/L (ref 18–29)
CREATININE: 0.63 mg/dL (ref 0.57–1.00)
Calcium: 8.8 mg/dL (ref 8.7–10.3)
Chloride: 99 mmol/L (ref 96–106)
GFR calc Af Amer: 101 mL/min/{1.73_m2} (ref >59)
GFR, EST NON AFRICAN AMERICAN: 88 mL/min/{1.73_m2} (ref >59)
GLOBULIN, TOTAL: 3 g/dL (ref 1.5–4.5)
Glucose: 101 mg/dL — ABNORMAL HIGH (ref 65–99)
POTASSIUM: 4.1 mmol/L (ref 3.5–5.2)
SODIUM: 140 mmol/L (ref 134–144)
Total Protein: 6.9 g/dL (ref 6.0–8.5)

## 2016-05-21 LAB — LIPID PANEL
CHOL/HDL RATIO: 6.4 ratio — AB (ref 0.0–4.4)
CHOLESTEROL TOTAL: 243 mg/dL — AB (ref 100–199)
HDL: 38 mg/dL — ABNORMAL LOW (ref >39)
LDL CALC: 183 mg/dL — AB (ref 0–99)
Triglycerides: 111 mg/dL (ref 0–149)
VLDL Cholesterol Cal: 22 mg/dL (ref 5–40)

## 2016-05-21 LAB — TSH: TSH: 1.57 u[IU]/mL (ref 0.450–4.500)

## 2016-05-21 LAB — VITAMIN D 25 HYDROXY (VIT D DEFICIENCY, FRACTURES): VIT D 25 HYDROXY: 32.7 ng/mL (ref 30.0–100.0)

## 2016-05-30 ENCOUNTER — Ambulatory Visit: Payer: Self-pay | Admitting: Family Medicine

## 2016-06-03 ENCOUNTER — Ambulatory Visit (INDEPENDENT_AMBULATORY_CARE_PROVIDER_SITE_OTHER): Payer: Medicare Other | Admitting: Family Medicine

## 2016-06-03 ENCOUNTER — Encounter: Payer: Self-pay | Admitting: Family Medicine

## 2016-06-03 ENCOUNTER — Ambulatory Visit
Admission: RE | Admit: 2016-06-03 | Discharge: 2016-06-03 | Disposition: A | Discharge status: Home / Self Care | Payer: Medicare Other | Source: Ambulatory Visit | Attending: Family Medicine | Admitting: Family Medicine

## 2016-06-03 VITALS — BP 122/60 | HR 64 | Temp 98.5°F | Resp 18 | Wt 150.0 lb

## 2016-06-03 DIAGNOSIS — Z720 Tobacco use: Secondary | ICD-10-CM | POA: Diagnosis not present

## 2016-06-03 DIAGNOSIS — I517 Cardiomegaly: Secondary | ICD-10-CM | POA: Diagnosis not present

## 2016-06-03 DIAGNOSIS — R1013 Epigastric pain: Secondary | ICD-10-CM | POA: Diagnosis not present

## 2016-06-03 DIAGNOSIS — R079 Chest pain, unspecified: Secondary | ICD-10-CM | POA: Diagnosis not present

## 2016-06-03 DIAGNOSIS — F0391 Unspecified dementia with behavioral disturbance: Secondary | ICD-10-CM

## 2016-06-03 DIAGNOSIS — D649 Anemia, unspecified: Secondary | ICD-10-CM | POA: Diagnosis not present

## 2016-06-03 MED ORDER — BUPROPION HCL ER (SR) 150 MG PO TB12: 150.0000 mg | ORAL_TABLET | Freq: Two times a day (BID) | ORAL | 12 refills | Status: DC

## 2016-06-03 NOTE — Patient Instructions (Addendum)
Use Tramadol for chest pain.

## 2016-06-03 NOTE — Progress Notes (Signed)
Subjective:  HPI Pt is here for a follow up of labs on 05/20/16 that showed a new onset of mild anemia. Per note repeat labs today. Pt is also complaining of chest pain. It started about 30 minutes ago and have been coming in waves. It is located in her right side of her chest. She does say she is a little more short of breath than normal. She was on the way over here when the chest pain started. She reports that the pain runs down her right arm. Her husband occupies her today and says that she has had this chest pain for years but pt says that it is a different pain than the pain that she has had for a while.  In reviewing her chart it looks like she had iron deficiency anemia in 2015 and colonoscopy and EGD were canceled the day of procedure. I do not see where it was rescheduled or redone. Prior to Admission medications   Medication Sig Start Date End Date Taking? Authorizing Provider  acetaminophen (TYLENOL) 500 MG tablet Take 500 mg by mouth every 6 (six) hours as needed.    Historical Provider, MD  albuterol (PROVENTIL HFA;VENTOLIN HFA) 108 (90 BASE) MCG/ACT inhaler Inhale 2 puffs into the lungs every 6 (six) hours as needed for wheezing or shortness of breath. 05/10/15   Lorie Phenix, MD  aspirin 325 MG tablet Take 325 mg by mouth daily.    Historical Provider, MD  buPROPion [facility] SR) 100 MG 12 hr tablet Take by mouth. 08/29/14   Historical Provider, MD  calcium-vitamin D (OSCAL) 250-125 MG-UNIT per tablet Take 1 tablet by mouth daily.    Historical Provider, MD  citalopram (CELEXA) 20 MG tablet 1 Tablet, Oral, daily 05/08/16   Maple Hudson., MD  clonazePAM (KLONOPIN) 0.5 MG tablet Take 1 tablet (0.5 mg total) by mouth 2 (two) times daily. 02/26/16   Lorie Phenix, MD  clopidogrel (PLAVIX) 75 MG tablet 1 Tablet, Oral, daily, by mouth 03/21/16   Lorie Phenix, MD  donepezil (ARICEPT) 5 MG tablet Take 1 tablet (5 mg total) by mouth at bedtime. 05/08/16   Richard Hulen Shouts., MD    ferrous sulfate 325 (65 FE) MG tablet Take by mouth. 05/05/14   Historical Provider, MD  fluticasone (FLONASE) 50 MCG/ACT nasal spray Place into the nose. 06/08/14   Historical Provider, MD  isosorbide mononitrate (IMDUR) 30 MG 24 hr tablet Take 1 tablet (30 mg total) by mouth daily. 05/08/16   Richard Hulen Shouts., MD  magnesium citrate SOLN Take 148 mLs (0.5 Bottles total) by mouth once. And repeat in 6 hours if needed. Patient not taking: Reported on 05/08/2016 05/09/15   Lorie Phenix, MD  metoprolol tartrate (LOPRESSOR) 25 MG tablet Take 0.5 tablets (12.5 mg total) by mouth 2 (two) times daily. 05/08/16   Richard Hulen Shouts., MD  nitroGLYCERIN (NITROSTAT) 0.4 MG SL tablet Place under the tongue.    Historical Provider, MD  Omega-3 Fatty Acids (FISH OIL) 1000 MG CAPS Take by mouth.    Historical Provider, MD  pantoprazole (PROTONIX) 40 MG tablet Take 1 tablet (40 mg total) by mouth daily. 03/21/16   Lorie Phenix, MD  traMADol (ULTRAM) 50 MG tablet Take 1 tablet (50 mg total) by mouth every 6 (six) hours as needed. 04/25/16   Maple Hudson., MD  traZODone (DESYREL) 50 MG tablet Take by mouth. 06/08/14   Historical Provider, MD    Patient Active Problem  List   Diagnosis Date Noted  . Hyperglycemia 05/09/2015  . Pelvic pain in female 05/09/2015  . Constipation 05/09/2015  . Anxiety 04/22/2015  . Autoimmune disease (HCC) 04/22/2015  . Body mass index (BMI) of 28.0-28.9 in adult 04/22/2015  . Atherosclerosis of coronary artery 04/22/2015  . Chronic headache 04/22/2015  . Dementia 04/22/2015  . Clinical depression 04/22/2015  . Dizziness 04/22/2015  . Family history of diabetes mellitus 04/22/2015  . Broken rib 04/22/2015  . Acid reflux 04/22/2015  . Cannot sleep 04/22/2015  . Amnesia 04/22/2015  . Cramp in muscle 04/22/2015  . Excessive urination at night 04/22/2015  . Decreased potassium in the blood 04/22/2015  . Excessive falling 04/22/2015  . Pain in rib 04/22/2015  .  History of biliary T-tube placement 04/22/2015  . Anemia 05/02/2014  . Dyspnea 04/11/2014  . Tobacco abuse 04/11/2014  . 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 Fibrositis 03/16/2014  . Heart attack (HCC) 03/16/2014  . Hypercholesteremia 04/15/2006    Past Medical History:  Diagnosis Date  . Asthma   . Emphysema lung (HCC)   . Heart attack (HCC) 2013  . Hypertension   . Peripheral nervous system disorder (HCC)   . Sinus trouble     Social History   Social History  . Marital status: Married    Spouse name: N/A  . Number of children: N/A  . Years of education: N/A   Occupational History  . Not on file.   Social History Main Topics  . Smoking status: Current Every Day Smoker    Packs/day: 1.00    Years: 55.00    Types: Cigarettes  . Smokeless tobacco: Never Used  . Alcohol use No  . Drug use: No  . Sexual activity: Not on file   Other Topics Concern  . Not on file   Social History Narrative  . No narrative on file    Allergies  Allergen Reactions  . Darvon [Propoxyphene]     hives  . Gabapentin     Throat closes    Review of Systems  Constitutional: Positive for malaise/fatigue.  HENT: Negative.   Eyes: Negative.   Respiratory: Positive for shortness of breath.   Cardiovascular: Positive for chest pain.  Gastrointestinal: Negative.   Genitourinary: Negative.   Musculoskeletal: Negative.   Skin: Negative.   Neurological: Positive for dizziness and weakness.  Endo/Heme/Allergies: Negative.   Psychiatric/Behavioral: Negative.     Immunization History  Administered Date(s) Administered  . Pneumococcal Polysaccharide-23 02/01/2010   Objective:  BP 122/60 (BP Location: Left Arm, Patient Position: Sitting, Cuff Size: Normal)   Pulse 64   Temp 98.5 F (36.9 C) (Oral)   Resp 18   Wt 150 lb (68 kg)   SpO2 98%   BMI 29.29 kg/m   Physical Exam  Constitutional: She is oriented to person, place, and time and  well-developed, well-nourished, and in no distress.  HENT:  Head: Normocephalic and atraumatic.  Right Ear: External ear normal.  Left Ear: External ear normal.  Nose: Nose normal.  Eyes: Conjunctivae and EOM are normal. Pupils are equal, round, and reactive to light.  Neck: Normal range of motion. Neck supple.  Cardiovascular: Normal rate, regular rhythm, normal heart sounds and intact distal pulses.   Pulmonary/Chest: Effort normal and breath sounds normal.  Abdominal: Soft. Bowel sounds are normal. There is tenderness (mid upper gastric tenderness).  Musculoskeletal: Normal range of motion.  Neurological: She is alert and oriented to person, place,  and time. She has normal reflexes. Gait normal. GCS score is 15.  Skin: Skin is warm and dry.  Psychiatric: Mood, memory, affect and judgment normal.    Lab Results  Component Value Date   WBC 9.3 05/20/2016   HGB 11.1 (A) 11/22/2014   HCT 32.4 (L) 05/20/2016   PLT 455 (H) 05/20/2016   GLUCOSE 101 (H) 05/20/2016   CHOL 243 (H) 05/20/2016   TRIG 111 05/20/2016   HDL 38 (L) 05/20/2016   LDLCALC 183 (H) 05/20/2016   TSH 1.570 05/20/2016   INR 1.1 03/20/2014   HGBA1C 6.0 11/22/2014    CMP     Component Value Date/Time   NA 140 05/20/2016 1028   NA 136 03/20/2014 2232   K 4.1 05/20/2016 1028   K 3.8 03/20/2014 2232   CL 99 05/20/2016 1028   CL 105 03/20/2014 2232   CO2 25 05/20/2016 1028   CO2 25 03/20/2014 2232   GLUCOSE 101 (H) 05/20/2016 1028   GLUCOSE 86 03/20/2014 2232   BUN 9 05/20/2016 1028   BUN 10 03/20/2014 2232   CREATININE 0.63 05/20/2016 1028   CREATININE 0.86 03/20/2014 2232   CALCIUM 8.8 05/20/2016 1028   CALCIUM 8.3 (L) 03/20/2014 2232   PROT 6.9 05/20/2016 1028   ALBUMIN 3.9 05/20/2016 1028   AST 29 05/20/2016 1028   ALT 22 05/20/2016 1028   ALKPHOS 45 05/20/2016 1028   BILITOT 0.2 05/20/2016 1028   GFRNONAA 88 05/20/2016 1028   GFRNONAA >60 03/20/2014 2232   GFRAA 101 05/20/2016 1028   GFRAA >60  03/20/2014 2232    Assessment and Plan :  1. Chest pain, unspecified chest pain type  - EKG 12-Lead - DG Chest 2 View; Future  2. Anemia, unspecified anemia type - CBC with Differential/Platelet - Iron and TIBC If this worsens will need GI referral. I cannot find last EGD/colonoscopy. One was scheduled for August 2015 and was canceled same day. I think the patient was having chest pain and required cardiac evaluation. 3. Epigastric abdominal pain PUD possible but patient is treated with pantoprazole which should be protective to a degree. - H. pylori antibody, IgG  4. Dementia, with behavioral disturbance Today he is only the second timeout ever seen the patient. Dementia appears to be mild. More likely cognitive impairment with some  Attached  mental illness  5. Tobacco abuse Patient  strongly advised to quit smoking. I do not think Chantix would be in her best interest. Try Wellbutrin. She is willing to do this. - buPROPion (WELLBUTRIN SR) 150 MG 12 hr tablet; Take 1 tablet (150 mg total) by mouth 2 (two) times daily.  Dispense: 60 tablet; Refill: 12  Patient was seen and examined by Dr. Julieanne Mansonichard Gilbert, and noted scribed by Dimas ChyleBrittany Byrd, CMA  Julieanne Mansonichard Gilbert MD [facility] 06/03/2016 3:22 PM

## 2016-06-04 ENCOUNTER — Telehealth: Payer: Self-pay

## 2016-06-04 DIAGNOSIS — A048 Other specified bacterial intestinal infections: Secondary | ICD-10-CM

## 2016-06-04 DIAGNOSIS — D509 Iron deficiency anemia, unspecified: Secondary | ICD-10-CM

## 2016-06-04 LAB — CBC WITH DIFFERENTIAL/PLATELET
BASOS: 1 %
Basophils Absolute: 0.1 10*3/uL (ref 0.0–0.2)
EOS (ABSOLUTE): 0.5 10*3/uL — AB (ref 0.0–0.4)
Eos: 4 %
Hematocrit: 29.7 % — ABNORMAL LOW (ref 34.0–46.6)
Hemoglobin: 9.5 g/dL — ABNORMAL LOW (ref 11.1–15.9)
IMMATURE GRANS (ABS): 0 10*3/uL (ref 0.0–0.1)
Immature Granulocytes: 0 %
LYMPHS ABS: 3.4 10*3/uL — AB (ref 0.7–3.1)
LYMPHS: 28 %
MCH: 23.9 pg — AB (ref 26.6–33.0)
MCHC: 32 g/dL (ref 31.5–35.7)
MCV: 75 fL — AB (ref 79–97)
MONOS ABS: 1.3 10*3/uL — AB (ref 0.1–0.9)
Monocytes: 10 %
NEUTROS ABS: 7 10*3/uL (ref 1.4–7.0)
Neutrophils: 57 %
PLATELETS: 438 10*3/uL — AB (ref 150–379)
RBC: 3.97 x10E6/uL (ref 3.77–5.28)
RDW: 18.7 % — ABNORMAL HIGH (ref 12.3–15.4)
WBC: 12.2 10*3/uL — ABNORMAL HIGH (ref 3.4–10.8)

## 2016-06-04 LAB — IRON AND TIBC
IRON SATURATION: 5 % — AB (ref 15–55)
IRON: 19 ug/dL — AB (ref 27–139)
Total Iron Binding Capacity: 383 ug/dL (ref 250–450)
UIBC: 364 ug/dL (ref 118–369)

## 2016-06-04 LAB — H. PYLORI ANTIBODY, IGG: H Pylori IgG: 6.6 U/mL — ABNORMAL HIGH (ref 0.0–0.8)

## 2016-06-04 NOTE — Telephone Encounter (Signed)
Pt advised.  She says that you did not prescribed an antibiotic but you did send in a script for Bupropion.  Please advise.  Thanks,   -Vernona Rieger

## 2016-06-04 NOTE — Telephone Encounter (Signed)
Pt is returning call.  CB#[phone]/MW

## 2016-06-04 NOTE — Telephone Encounter (Signed)
-----   Message from Maple Hudson., MD sent at 06/04/2016  9:35 AM EDT ----- No pneumonia or heart failure. Take antibiotics. Definitely stop smoking.

## 2016-06-04 NOTE — Telephone Encounter (Signed)
LMTCB 06/04/2016  Thanks,   -Abid Bolla  

## 2016-06-06 ENCOUNTER — Telehealth: Payer: Self-pay

## 2016-06-06 ENCOUNTER — Other Ambulatory Visit: Payer: Self-pay

## 2016-06-06 MED ORDER — NA SULFATE-K SULFATE-MG SULF 17.5-3.13-1.6 GM/177ML PO SOLN: 1.0000 | Freq: Once | ORAL | 0 refills | Status: AC

## 2016-06-06 MED ORDER — FERROUS SULFATE 325 (65 FE) MG PO TABS: 325.0000 mg | ORAL_TABLET | Freq: Two times a day (BID) | ORAL | 5 refills | Status: DC

## 2016-06-06 NOTE — Telephone Encounter (Signed)
Screening Colonoscopy Z12.11 Anemia D64.9 [facility] 06/13/2016 Please pre cert

## 2016-06-06 NOTE — Telephone Encounter (Signed)
Patient advised. Please set up appointment for GI.-aa

## 2016-06-06 NOTE — Telephone Encounter (Signed)
Gastroenterology Pre-Procedure Review  Request Date: 06/13/2016 Requesting Physician: Dr. Sullivan Lone  PATIENT REVIEW QUESTIONS: The patient responded to the following health history questions as indicated:    1. Are you having any GI issues? yes (abdominal pain) 2. Do you have a personal history of Polyps? no 3. Do you have a family history of Colon Cancer or Polyps? no 4. Diabetes Mellitus? no 5. Joint replacements in the past 12 months?no 6. Major health problems in the past 3 months?no 7. Any artificial heart valves, MVP, or defibrillator?no    MEDICATIONS & ALLERGIES:    Patient reports the following regarding taking any anticoagulation/antiplatelet therapy:   Plavix, Coumadin, Eliquis, Xarelto, Lovenox, Pradaxa, Brilinta, or Effient? yes (Plavix) Aspirin? yes (heart health)  Patient confirms/reports the following medications:  Current Outpatient Prescriptions  Medication Sig Dispense Refill  . aspirin 325 MG tablet Take 325 mg by mouth daily.    Marland Kitchen buPROPion (WELLBUTRIN SR) 100 MG 12 hr tablet Take by mouth.    . calcium-vitamin D (OSCAL) 250-125 MG-UNIT per tablet Take 1 tablet by mouth daily.    . citalopram (CELEXA) 20 MG tablet 1 Tablet, Oral, daily 30 tablet 5  . clonazePAM (KLONOPIN) 0.5 MG tablet Take 1 tablet (0.5 mg total) by mouth 2 (two) times daily. 60 tablet 5  . clopidogrel (PLAVIX) 75 MG tablet 1 Tablet, Oral, daily, by mouth 90 tablet 0  . donepezil (ARICEPT) 5 MG tablet Take 1 tablet (5 mg total) by mouth at bedtime. 90 tablet 1  . ferrous sulfate 325 (65 FE) MG tablet Take 1 tablet (325 mg total) by mouth 2 (two) times daily with a meal. 60 tablet 5  . fluticasone (FLONASE) 50 MCG/ACT nasal spray Place into the nose.    . magnesium citrate SOLN Take 148 mLs (0.5 Bottles total) by mouth once. And repeat in 6 hours if needed. 195 mL 1  . metoprolol tartrate (LOPRESSOR) 25 MG tablet Take 0.5 tablets (12.5 mg total) by mouth 2 (two) times daily. 90 tablet 1  .  nitroGLYCERIN (NITROSTAT) 0.4 MG SL tablet Place under the tongue.    . Omega-3 Fatty Acids (FISH OIL) 1000 MG CAPS Take by mouth.    . pantoprazole (PROTONIX) 40 MG tablet Take 1 tablet (40 mg total) by mouth daily. 90 tablet 0  . traMADol (ULTRAM) 50 MG tablet Take 1 tablet (50 mg total) by mouth every 6 (six) hours as needed. 120 tablet 1  . albuterol (PROVENTIL HFA;VENTOLIN HFA) 108 (90 BASE) MCG/ACT inhaler Inhale 2 puffs into the lungs every 6 (six) hours as needed for wheezing or shortness of breath. (Patient not taking: Reported on 06/06/2016) 18 g 3   No current facility-administered medications for this visit.     Patient confirms/reports the following allergies:  Allergies  Allergen Reactions  . Gabapentin Anaphylaxis    Throat closes  . Darvon [Propoxyphene]     hives    No orders of the defined types were placed in this encounter.   AUTHORIZATION INFORMATION Primary Insurance: 1D#: Group #:  Secondary Insurance: 1D#: Group #:  SCHEDULE INFORMATION: Date: 06/13/2016 Time: Location: [facility]

## 2016-06-07 ENCOUNTER — Other Ambulatory Visit: Payer: Self-pay

## 2016-06-07 ENCOUNTER — Encounter: Payer: Self-pay | Admitting: *Deleted

## 2016-06-07 DIAGNOSIS — A048 Other specified bacterial intestinal infections: Secondary | ICD-10-CM

## 2016-06-07 NOTE — Telephone Encounter (Signed)
Received message from GI that they will see patient but not till middle of September. Do we need to go ahead and treat H Pylori? Please review-aa

## 2016-06-10 NOTE — Telephone Encounter (Signed)
Prevpak if no allergies--stop Rx for omeprazole

## 2016-06-10 NOTE — Telephone Encounter (Signed)
Prevpac interacts with citalopram saying QT elongation. Do you still want to fill medication? Please advise. Thanks!

## 2016-06-10 NOTE — Telephone Encounter (Signed)
Spoke with Dr Sullivan Lone and since patient will be seen ion 3 days with GI for colonoscopy will defer treatment to them.-aa

## 2016-06-10 NOTE — Telephone Encounter (Signed)
Has colonoscopy appointment on 06/13/16 as per GI message received today-aa

## 2016-06-12 NOTE — Discharge Instructions (Signed)

## 2016-06-13 ENCOUNTER — Encounter
Admission: RE | Disposition: A | Discharge status: Home / Self Care | Payer: Self-pay | Source: Ambulatory Visit | Attending: Gastroenterology

## 2016-06-13 ENCOUNTER — Encounter: Payer: Self-pay | Admitting: Anesthesiology

## 2016-06-13 ENCOUNTER — Ambulatory Visit
Admission: RE | Admit: 2016-06-13 | Discharge: 2016-06-13 | Disposition: A | Discharge status: Home / Self Care | Payer: Medicare Other | Source: Ambulatory Visit | Attending: Gastroenterology | Admitting: Gastroenterology

## 2016-06-13 DIAGNOSIS — R7309 Other abnormal glucose: Secondary | ICD-10-CM | POA: Diagnosis not present

## 2016-06-13 DIAGNOSIS — I2510 Atherosclerotic heart disease of native coronary artery without angina pectoris: Secondary | ICD-10-CM | POA: Diagnosis not present

## 2016-06-13 DIAGNOSIS — D649 Anemia, unspecified: Secondary | ICD-10-CM | POA: Diagnosis not present

## 2016-06-13 DIAGNOSIS — R079 Chest pain, unspecified: Secondary | ICD-10-CM | POA: Diagnosis not present

## 2016-06-13 DIAGNOSIS — I10 Essential (primary) hypertension: Secondary | ICD-10-CM | POA: Insufficient documentation

## 2016-06-13 DIAGNOSIS — Z538 Procedure and treatment not carried out for other reasons: Secondary | ICD-10-CM | POA: Diagnosis not present

## 2016-06-13 DIAGNOSIS — Z1211 Encounter for screening for malignant neoplasm of colon: Secondary | ICD-10-CM | POA: Insufficient documentation

## 2016-06-13 DIAGNOSIS — Z955 Presence of coronary angioplasty implant and graft: Secondary | ICD-10-CM | POA: Diagnosis not present

## 2016-06-13 HISTORY — DX: Gastro-esophageal reflux disease without esophagitis: K21.9

## 2016-06-13 HISTORY — DX: Anemia, unspecified: D64.9

## 2016-06-13 HISTORY — DX: Fibromyalgia: M79.7

## 2016-06-13 HISTORY — DX: Reserved for inherently not codable concepts without codable children: IMO0001

## 2016-06-13 HISTORY — DX: Presence of dental prosthetic device (complete) (partial): Z97.2

## 2016-06-13 HISTORY — DX: Unspecified hemorrhoids: K64.9

## 2016-06-13 SURGERY — COLONOSCOPY WITH PROPOFOL
Anesthesia: General

## 2016-06-13 MED ORDER — LACTATED RINGERS IV SOLN
INTRAVENOUS | Status: DC
  Administered 2016-06-13: 11:00:00 via INTRAVENOUS

## 2016-06-13 SURGICAL SUPPLY — 35 items
BALLN DILATOR 10-12 8 (BALLOONS)
BALLN DILATOR 12-15 8 (BALLOONS)
BALLN DILATOR 15-18 8 (BALLOONS)
BALLN DILATOR CRE 0-12 8 (BALLOONS)
BALLN DILATOR ESOPH 8 10 CRE (MISCELLANEOUS) IMPLANT
BALLOON DILATOR 12-15 8 (BALLOONS) IMPLANT
BALLOON DILATOR 15-18 8 (BALLOONS) IMPLANT
BALLOON DILATOR CRE 0-12 8 (BALLOONS) IMPLANT
BLOCK BITE 60FR ADLT L/F GRN (MISCELLANEOUS) ×3 IMPLANT
CANISTER SUCT 1200ML W/VALVE (MISCELLANEOUS) ×3 IMPLANT
CLIP HMST 235XBRD CATH ROT (MISCELLANEOUS) IMPLANT
CLIP RESOLUTION 360 11X235 (MISCELLANEOUS)
FCP ESCP3.2XJMB 240X2.8X (MISCELLANEOUS)
FORCEPS BIOP RAD 4 LRG CAP 4 (CUTTING FORCEPS) IMPLANT
FORCEPS BIOP RJ4 240 W/NDL (MISCELLANEOUS)
FORCEPS ESCP3.2XJMB 240X2.8X (MISCELLANEOUS) IMPLANT
GOWN CVR UNV OPN BCK APRN NK (MISCELLANEOUS) ×2 IMPLANT
GOWN ISOL THUMB LOOP REG UNIV (MISCELLANEOUS) ×4
INJECTOR VARIJECT VIN23 (MISCELLANEOUS) IMPLANT
KIT DEFENDO VALVE AND CONN (KITS) IMPLANT
KIT ENDO PROCEDURE OLY (KITS) ×3 IMPLANT
MARKER SPOT ENDO TATTOO 5ML (MISCELLANEOUS) IMPLANT
PAD GROUND ADULT SPLIT (MISCELLANEOUS) IMPLANT
PROBE APC STR FIRE (PROBE) IMPLANT
RETRIEVER NET PLAT FOOD (MISCELLANEOUS) IMPLANT
RETRIEVER NET ROTH 2.5X230 LF (MISCELLANEOUS) ×3 IMPLANT
SNARE SHORT THROW 13M SML OVAL (MISCELLANEOUS) IMPLANT
SNARE SHORT THROW 30M LRG OVAL (MISCELLANEOUS) IMPLANT
SNARE SNG USE RND 15MM (INSTRUMENTS) IMPLANT
SPOT EX ENDOSCOPIC TATTOO (MISCELLANEOUS)
SYR INFLATION 60ML (SYRINGE) IMPLANT
TRAP ETRAP POLY (MISCELLANEOUS) IMPLANT
VARIJECT INJECTOR VIN23 (MISCELLANEOUS)
WATER STERILE IRR 250ML POUR (IV SOLUTION) ×3 IMPLANT
WIRE CRE 18-20MM 8CM F G (MISCELLANEOUS) IMPLANT

## 2016-06-13 NOTE — Progress Notes (Signed)
Pt arrived to [facility] for EGD and Colonoscopy with Dr. Servando Snare (GI).  Pt had chest pain 2 days ago. Denies SOB. PMH includes CAD, stents x2, smoker, borderline DM, HTN, etc.  On chart review, pt has had intermittent CP for the last year.   Pt saw Dr. Darrold Junker (cardiology) in Aug 2016, at which time an echo and stress test were ordered. Pt did NOT get these tests done.  On 06/03/2016, pt saw Dr. Sullivan Lone (PCP), at which time pt was having active CP, running down her arms.  Spoke with Dr. Sullivan Lone and Dr. Darrold Junker on the phone today.  Dr. Sullivan Lone agrees on not proceeding with procedure today, until cardiology followup.  Dr. Darrold Junker agrees that pt should schedule appt with his office.  D/w Dr. Servando Snare, patient, and family.

## 2016-06-13 NOTE — Progress Notes (Signed)
Patients endoscopy procedure canceled due to the patients recent complaint of chest pain and no recent cardiology visit. MD Marry Guan and MD Servando Snare agreed that patient was a risk for anesthesia. Patient informed that she needed to follow up with cardiology.

## 2016-07-03 ENCOUNTER — Ambulatory Visit: Payer: Self-pay | Admitting: Family Medicine

## 2016-07-08 ENCOUNTER — Ambulatory Visit: Payer: Self-pay | Admitting: Family Medicine

## 2016-07-15 ENCOUNTER — Ambulatory Visit: Payer: Self-pay | Admitting: Family Medicine

## 2016-08-06 ENCOUNTER — Ambulatory Visit: Payer: Self-pay | Admitting: Family Medicine

## 2016-09-09 ENCOUNTER — Ambulatory Visit: Payer: Self-pay | Admitting: Family Medicine

## 2016-09-12 ENCOUNTER — Ambulatory Visit: Payer: Self-pay | Admitting: Family Medicine

## 2016-09-26 ENCOUNTER — Ambulatory Visit (INDEPENDENT_AMBULATORY_CARE_PROVIDER_SITE_OTHER): Payer: Medicare Other | Admitting: Family Medicine

## 2016-09-26 ENCOUNTER — Encounter: Payer: Self-pay | Admitting: Family Medicine

## 2016-09-26 VITALS — BP 136/68 | HR 74 | Temp 98.6°F | Resp 16

## 2016-09-26 DIAGNOSIS — R252 Cramp and spasm: Secondary | ICD-10-CM | POA: Diagnosis not present

## 2016-09-26 DIAGNOSIS — R413 Other amnesia: Secondary | ICD-10-CM

## 2016-09-26 DIAGNOSIS — R739 Hyperglycemia, unspecified: Secondary | ICD-10-CM

## 2016-09-26 DIAGNOSIS — G8929 Other chronic pain: Secondary | ICD-10-CM

## 2016-09-26 DIAGNOSIS — F329 Major depressive disorder, single episode, unspecified: Secondary | ICD-10-CM

## 2016-09-26 DIAGNOSIS — D509 Iron deficiency anemia, unspecified: Secondary | ICD-10-CM

## 2016-09-26 DIAGNOSIS — Z720 Tobacco use: Secondary | ICD-10-CM

## 2016-09-26 DIAGNOSIS — F32A Depression, unspecified: Secondary | ICD-10-CM

## 2016-09-26 MED ORDER — CITALOPRAM HYDROBROMIDE 10 MG PO TABS: 30.0000 mg | ORAL_TABLET | Freq: Every day | ORAL | 11 refills | Status: DC

## 2016-09-26 MED ORDER — TRAMADOL HCL 50 MG PO TABS: 50.0000 mg | ORAL_TABLET | Freq: Four times a day (QID) | ORAL | 4 refills | Status: DC | PRN

## 2016-09-26 NOTE — Progress Notes (Signed)
Subjective:  HPI Pt is here today for refills on her Tramadol for chronic pain. She also reports that she is extremely tired. Husband states that she does not take her medications right and sleeps all day is up most of the night. She says she does not do this. She reports that she feels like she is coming down with the flu, is how she describes the feeling.   patient's niece recently died and she has been upset about that. She has refused to undergo cardiac evaluation that was recommended by cardiology.  Prior to Admission medications   Medication Sig Start Date End Date Taking? Authorizing Provider  albuterol (PROVENTIL HFA;VENTOLIN HFA) 108 (90 BASE) MCG/ACT inhaler Inhale 2 puffs into the lungs every 6 (six) hours as needed for wheezing or shortness of breath. 05/10/15  Yes Lorie PhenixNancy Maloney, MD  aspirin 325 MG tablet Take 325 mg by mouth daily.   Yes Historical Provider, MD  calcium-vitamin D (OSCAL) 250-125 MG-UNIT per tablet Take 1 tablet by mouth daily.   Yes Historical Provider, MD  citalopram (CELEXA) 20 MG tablet 1 Tablet, Oral, daily 05/08/16  Yes Aden Sek Hulen ShoutsL  Jr., MD  clonazePAM (KLONOPIN) 0.5 MG tablet Take 1 tablet (0.5 mg total) by mouth 2 (two) times daily. 02/26/16  Yes Lorie PhenixNancy Maloney, MD  clopidogrel (PLAVIX) 75 MG tablet 1 Tablet, Oral, daily, by mouth 03/21/16  Yes Lorie PhenixNancy Maloney, MD  donepezil (ARICEPT) 5 MG tablet Take 1 tablet (5 mg total) by mouth at bedtime. 05/08/16  Yes Mckinna Demars Hulen ShoutsL  Jr., MD  isosorbide dinitrate (ISORDIL) 30 MG tablet Take 30 mg by mouth daily.   Yes Historical Provider, MD  magnesium citrate SOLN Take 148 mLs (0.5 Bottles total) by mouth once. And repeat in 6 hours if needed. 05/09/15  Yes Lorie PhenixNancy Maloney, MD  metoprolol tartrate (LOPRESSOR) 25 MG tablet Take 0.5 tablets (12.5 mg total) by mouth 2 (two) times daily. 05/08/16  Yes Monroe Qin Hulen ShoutsL  Jr., MD  pantoprazole (PROTONIX) 40 MG tablet Take 1 tablet (40 mg total) by mouth daily. 03/21/16  Yes Lorie PhenixNancy  Maloney, MD  buPROPion [facility](WELLBUTRIN SR) 100 MG 12 hr tablet Take by mouth. 08/29/14   Historical Provider, MD  ferrous sulfate 325 (65 FE) MG tablet Take 1 tablet (325 mg total) by mouth 2 (two) times daily with a meal. Patient not taking: Reported on 09/26/2016 06/06/16   Maple Hudsonichard L  Jr., MD  fluticasone (FLONASE) 50 MCG/ACT nasal spray Place into the nose. 06/08/14   Historical Provider, MD  nitroGLYCERIN (NITROSTAT) 0.4 MG SL tablet Place under the tongue.    Historical Provider, MD  Omega-3 Fatty Acids (FISH OIL) 1000 MG CAPS Take by mouth.    Historical Provider, MD  traMADol (ULTRAM) 50 MG tablet Take 1 tablet (50 mg total) by mouth every 6 (six) hours as needed. Patient not taking: Reported on 09/26/2016 04/25/16   Maple Hudsonichard L  Jr., MD    Patient Active Problem List   Diagnosis Date Noted  . Hyperglycemia 05/09/2015  . Pelvic pain in female 05/09/2015  . Constipation 05/09/2015  . Anxiety 04/22/2015  . Autoimmune disease (HCC) 04/22/2015  . Body mass index (BMI) of 28.0-28.9 in adult 04/22/2015  . Atherosclerosis of coronary artery 04/22/2015  . Chronic headache 04/22/2015  . Dementia 04/22/2015  . Clinical depression 04/22/2015  . Dizziness 04/22/2015  . Family history of diabetes mellitus 04/22/2015  . Broken rib 04/22/2015  . Acid reflux 04/22/2015  . Cannot sleep 04/22/2015  .  Amnesia 04/22/2015  . Cramp in muscle 04/22/2015  . Excessive urination at night 04/22/2015  . Decreased potassium in the blood 04/22/2015  . Excessive falling 04/22/2015  . Pain in rib 04/22/2015  . History of biliary T-tube placement 04/22/2015  . Anemia 05/02/2014  . Dyspnea 04/11/2014  . Tobacco abuse 04/11/2014  . 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 Fibrositis 03/16/2014  . Heart attack 03/16/2014  . Hypercholesteremia 04/15/2006    Past Medical History:  Diagnosis Date  . Anemia   . Asthma   . Emphysema lung (HCC)   . Fibromyalgia   . GERD  (gastroesophageal reflux disease)   . Heart attack 2013  . Hemorrhoids   . Hypertension   . Peripheral nervous system disorder (HCC)   . Shortness of breath dyspnea   . Sinus trouble   . Wears dentures    full upper    Social History   Social History  . Marital status: Married    Spouse name: N/A  . Number of children: N/A  . Years of education: N/A   Occupational History  . Not on file.   Social History Main Topics  . Smoking status: Current Every Day Smoker    Packs/day: 1.00    Years: 55.00    Types: Cigarettes  . Smokeless tobacco: Never Used  . Alcohol use No  . Drug use: No  . Sexual activity: Not on file   Other Topics Concern  . Not on file   Social History Narrative  . No narrative on file    Allergies  Allergen Reactions  . Gabapentin Anaphylaxis    Throat closes  . Darvon [Propoxyphene]     hives  . Shellfish Allergy     "causes problems with my fibromyalgia"    Review of Systems  Constitutional: Positive for malaise/fatigue.  HENT: Negative.   Eyes: Negative.   Respiratory: Positive for cough and shortness of breath.   Cardiovascular: Negative.   Gastrointestinal: Negative.   Genitourinary: Negative.   Musculoskeletal: Positive for joint pain and myalgias.  Skin: Negative.   Neurological: Positive for dizziness and weakness.  Endo/Heme/Allergies: Negative.   Psychiatric/Behavioral: Positive for memory loss.    Immunization History  Administered Date(s) Administered  . Pneumococcal Polysaccharide-23 02/01/2010    Objective:  BP 136/68 (BP Location: Left Arm, Patient Position: Sitting, Cuff Size: Normal)   Pulse 74   Temp 98.6 F (37 C) (Oral)   Resp 16   SpO2 98%   Physical Exam  Constitutional: She is oriented to person, place, and time and well-developed, well-nourished, and in no distress.  HENT:  Head: Normocephalic and atraumatic.  Eyes: Conjunctivae and EOM are normal. Pupils are equal, round, and reactive to light.    Neck: Normal range of motion. Neck supple.  Cardiovascular: Normal rate, regular rhythm, normal heart sounds and intact distal pulses.   Pulmonary/Chest: Effort normal and breath sounds normal.  Abdominal: Soft.  Neurological: She is alert and oriented to person, place, and time. She has normal reflexes. Gait normal. GCS score is 15.  Skin: Skin is warm and dry.  Psychiatric: Mood, memory and affect normal.    Lab Results  Component Value Date   WBC 12.2 (H) 06/03/2016   HGB 11.1 (A) 11/22/2014   HCT 29.7 (L) 06/03/2016   PLT 438 (H) 06/03/2016   GLUCOSE 101 (H) 05/20/2016   CHOL 243 (H) 05/20/2016   TRIG 111 05/20/2016   HDL 38 (L) 05/20/2016  LDLCALC 183 (H) 05/20/2016   TSH 1.570 05/20/2016   INR 1.1 03/20/2014   HGBA1C 6.0 11/22/2014    CMP     Component Value Date/Time   NA 140 05/20/2016 1028   NA 136 03/20/2014 2232   K 4.1 05/20/2016 1028   K 3.8 03/20/2014 2232   CL 99 05/20/2016 1028   CL 105 03/20/2014 2232   CO2 25 05/20/2016 1028   CO2 25 03/20/2014 2232   GLUCOSE 101 (H) 05/20/2016 1028   GLUCOSE 86 03/20/2014 2232   BUN 9 05/20/2016 1028   BUN 10 03/20/2014 2232   CREATININE 0.63 05/20/2016 1028   CREATININE 0.86 03/20/2014 2232   CALCIUM 8.8 05/20/2016 1028   CALCIUM 8.3 (L) 03/20/2014 2232   PROT 6.9 05/20/2016 1028   ALBUMIN 3.9 05/20/2016 1028   AST 29 05/20/2016 1028   ALT 22 05/20/2016 1028   ALKPHOS 45 05/20/2016 1028   BILITOT 0.2 05/20/2016 1028   GFRNONAA 88 05/20/2016 1028   GFRNONAA >60 03/20/2014 2232   GFRAA 101 05/20/2016 1028   GFRAA >60 03/20/2014 2232    Assessment and Plan :  1. Iron deficiency anemia, unspecified iron deficiency anemia type  - CBC with Differential/Platelet  2. Tobacco abuse   3. Depression, unspecified depression type  - TSH  4. Hyperglycemia  - Hemoglobin A1c - Comprehensive metabolic panel  5. Cramp in muscle   6. Other chronic pain  - traMADol (ULTRAM) 50 MG tablet; Take 1 tablet  (50 mg total) by mouth every 6 (six) hours as needed.  Dispense: 120 tablet; Refill: 4  7. Memory loss 1. Iron deficiency anemia, unspecified iron deficiency anemia type  - CBC with Differential/Platelet  2. Tobacco abuse  patient strongly encouraged to stop smoking. - CT CHEST LUNG CA SCREEN LOW DOSE W/O CM; Future  3. Depression, unspecified depression type  - TSH   4. Hyperglycemia  - Hemoglobin A1c - Comprehensive metabolic panel  5. Cramp in muscle   6. Other chronic pain  - traMADol (ULTRAM) 50 MG tablet; Take 1 tablet (50 mg total) by mouth every 6 (six) hours as needed.  Dispense: 120 tablet; Refill: 4  7. Memory loss  8. Complete noncompliance with follow-up  I she really needs to keep cardiology appointment. This  Referral is made for her.  I have done the exam and reviewed the chart and it is accurate to the best of my knowledge. Dentist has been used and  any errors in dictation or transcription are unintentional. Julieanne Manson M.D. [facility]     HPI, Exam, and A&P Transcribed under the direction and in the presence of Arun Herrod L. Wendelyn Breslow, MD  Electronically Signed: Dimas Chyle, CMA I have done the exam and reviewed the above chart and it is accurate to the best of my knowledge. Dentist has been used in this note in any air is in the dictation or transcription are unintentional.  Julieanne Manson MD [facility] 09/26/2016 4:34 PM

## 2016-10-09 ENCOUNTER — Telehealth: Payer: Self-pay | Admitting: *Deleted

## 2016-10-09 ENCOUNTER — Other Ambulatory Visit: Payer: Self-pay

## 2016-10-09 DIAGNOSIS — Z87891 Personal history of nicotine dependence: Secondary | ICD-10-CM

## 2016-10-09 DIAGNOSIS — F419 Anxiety disorder, unspecified: Secondary | ICD-10-CM

## 2016-10-09 MED ORDER — CLONAZEPAM 0.5 MG PO TABS: 0.5000 mg | ORAL_TABLET | Freq: Two times a day (BID) | ORAL | 5 refills | Status: DC

## 2016-10-09 NOTE — Telephone Encounter (Signed)
Received referral for initial lung cancer screening scan. Contacted patient's spouse and obtained smoking history,(current, 55 pack year) as well as answering questions related to screening process. Patient denies signs of lung cancer such as weight loss or hemoptysis. Patient denies comorbidity that would prevent curative treatment if lung cancer were found. Patient is tentatively scheduled for shared decision making visit and CT scan on 10/15/16 at 1:30pm, pending insurance approval from business office.

## 2016-10-09 NOTE — Telephone Encounter (Signed)
RX called in-aa 

## 2016-10-09 NOTE — Telephone Encounter (Signed)
rx refill for Clonazepam from pharmacy, Dr Wonda Olds patient-aa

## 2016-10-15 ENCOUNTER — Ambulatory Visit
Admission: RE | Admit: 2016-10-15 | Discharge: 2016-10-15 | Disposition: A | Discharge status: Home / Self Care | Payer: Medicare Other | Source: Ambulatory Visit | Attending: Oncology | Admitting: Oncology

## 2016-10-15 ENCOUNTER — Inpatient Hospital Stay: Payer: Medicare Other | Attending: Oncology | Admitting: Oncology

## 2016-10-15 DIAGNOSIS — R739 Hyperglycemia, unspecified: Secondary | ICD-10-CM | POA: Diagnosis not present

## 2016-10-15 DIAGNOSIS — I700 Atherosclerosis of aorta: Secondary | ICD-10-CM | POA: Diagnosis not present

## 2016-10-15 DIAGNOSIS — Z87891 Personal history of nicotine dependence: Secondary | ICD-10-CM | POA: Insufficient documentation

## 2016-10-15 DIAGNOSIS — I2510 Atherosclerotic heart disease of native coronary artery without angina pectoris: Secondary | ICD-10-CM | POA: Diagnosis not present

## 2016-10-15 DIAGNOSIS — D509 Iron deficiency anemia, unspecified: Secondary | ICD-10-CM | POA: Diagnosis not present

## 2016-10-15 DIAGNOSIS — Z122 Encounter for screening for malignant neoplasm of respiratory organs: Secondary | ICD-10-CM

## 2016-10-15 NOTE — Progress Notes (Signed)
In accordance with CMS guidelines, patient has met eligibility criteria including age, absence of signs or symptoms of lung cancer.  Social History  Substance Use Topics  . Smoking status: Current Every Day Smoker    Packs/day: 1.00    Years: 55.00    Types: Cigarettes  . Smokeless tobacco: Never Used  . Alcohol use No     A shared decision-making session was conducted prior to the performance of CT scan. This includes one or more decision aids, includes benefits and harms of screening, follow-up diagnostic testing, over-diagnosis, false positive rate, and total radiation exposure.  Counseling on the importance of adherence to annual lung cancer LDCT screening, impact of co-morbidities, and ability or willingness to undergo diagnosis and treatment is imperative for compliance of the program.  Counseling on the importance of continued smoking cessation for former smokers; the importance of smoking cessation for current smokers, and information about tobacco cessation interventions have been given to patient including [place] [facility] and [facility] quit [place] programs.  Written order for lung cancer screening with LDCT has been given to the patient and any and all questions have been answered to the best of my abilities.   Yearly follow up will be coordinated by Shawn Perkins, Thoracic Navigator.  

## 2016-10-16 LAB — CBC WITH DIFFERENTIAL/PLATELET
Basophils Absolute: 0.1 10*3/uL (ref 0.0–0.2)
Basos: 1 %
EOS (ABSOLUTE): 0.4 10*3/uL (ref 0.0–0.4)
Eos: 3 %
Hematocrit: 31.5 % — ABNORMAL LOW (ref 34.0–46.6)
Hemoglobin: 9.6 g/dL — ABNORMAL LOW (ref 11.1–15.9)
Immature Grans (Abs): 0 10*3/uL (ref 0.0–0.1)
Immature Granulocytes: 0 %
Lymphocytes Absolute: 3.2 10*3/uL — ABNORMAL HIGH (ref 0.7–3.1)
Lymphs: 25 %
MCH: 23.1 pg — ABNORMAL LOW (ref 26.6–33.0)
MCHC: 30.5 g/dL — ABNORMAL LOW (ref 31.5–35.7)
MCV: 76 fL — ABNORMAL LOW (ref 79–97)
Monocytes Absolute: 0.9 10*3/uL (ref 0.1–0.9)
Monocytes: 7 %
Neutrophils Absolute: 8.4 10*3/uL — ABNORMAL HIGH (ref 1.4–7.0)
Neutrophils: 64 %
Platelets: 553 10*3/uL — ABNORMAL HIGH (ref 150–379)
RBC: 4.15 x10E6/uL (ref 3.77–5.28)
RDW: 17.6 % — ABNORMAL HIGH (ref 12.3–15.4)
WBC: 12.9 10*3/uL — ABNORMAL HIGH (ref 3.4–10.8)

## 2016-10-16 LAB — COMPREHENSIVE METABOLIC PANEL
ALBUMIN: 3.9 g/dL (ref 3.5–4.8)
ALT: 18 IU/L (ref 0–32)
AST: 20 IU/L (ref 0–40)
Albumin/Globulin Ratio: 1.1 — ABNORMAL LOW (ref 1.2–2.2)
Alkaline Phosphatase: 54 IU/L (ref 39–117)
BUN / CREAT RATIO: 13 (ref 12–28)
BUN: 9 mg/dL (ref 8–27)
CO2: 26 mmol/L (ref 18–29)
Calcium: 9.2 mg/dL (ref 8.7–10.3)
Chloride: 94 mmol/L — ABNORMAL LOW (ref 96–106)
Creatinine, Ser: 0.71 mg/dL (ref 0.57–1.00)
GFR calc non Af Amer: 83 mL/min/{1.73_m2} (ref >59)
GFR, EST AFRICAN AMERICAN: 96 mL/min/{1.73_m2} (ref >59)
GLOBULIN, TOTAL: 3.5 g/dL (ref 1.5–4.5)
Glucose: 108 mg/dL — ABNORMAL HIGH (ref 65–99)
Potassium: 4.2 mmol/L (ref 3.5–5.2)
SODIUM: 136 mmol/L (ref 134–144)
TOTAL PROTEIN: 7.4 g/dL (ref 6.0–8.5)

## 2016-10-16 LAB — IRON AND TIBC
Iron Saturation: 8 % — CL (ref 15–55)
Iron: 32 ug/dL (ref 27–139)
TIBC: 390 ug/dL (ref 250–450)
UIBC: 358 ug/dL (ref 118–369)

## 2016-10-16 LAB — HEMOGLOBIN A1C
Est. average glucose Bld gHb Est-mCnc: 123 mg/dL
HEMOGLOBIN A1C: 5.9 % — AB (ref 4.8–5.6)

## 2016-10-16 LAB — TSH: TSH: 2.16 u[IU]/mL (ref 0.450–4.500)

## 2016-10-17 ENCOUNTER — Telehealth: Payer: Self-pay | Admitting: *Deleted

## 2016-10-17 NOTE — Telephone Encounter (Signed)
Notified patient of LDCT lung cancer screening results with recommendation for 12 month follow up imaging. Also notified of incidental finding noted below. Patient verbalizes understanding.   IMPRESSION: 1. Lung-RADS Category 2-S, benign appearance or behavior. Continue annual screening with low-dose chest CT without contrast in 12 months. 2. The "S" modifier above refers to potentially clinically significant non lung cancer related findings. Specifically, left main and 3 vessel coronary atherosclerosis. 3. Possible interstitial lung disease characterized by patchy subpleural reticulation and ground-glass attenuation without significant traction bronchiectasis or frank honeycombing. No basilar gradient. Differential includes nonspecific interstitial pneumonia (NSIP), with usual interstitial pneumonia (UIP) not excluded. Consider a follow-up high-resolution chest CT study in 6-12 months to assess temporal pattern stability, as clinically warranted. 4. Aortic atherosclerosis. 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

## 2016-10-17 NOTE — Telephone Encounter (Signed)
Pt advised. Pt does not have cardiology or GI appointments set up yet. Advised pt to schedule these appointments, and then call back to schedule FU with Dr. Sullivan Lone. Allene Dillon, CMA

## 2016-10-17 NOTE — Telephone Encounter (Signed)
Follow-up appointment with me after she sees cardiology and GI.

## 2016-10-22 ENCOUNTER — Other Ambulatory Visit: Payer: Self-pay | Admitting: Family Medicine

## 2016-10-22 NOTE — Telephone Encounter (Signed)
LOV 09/26/2016. Emily Drozdowski, CMA  

## 2016-11-30 ENCOUNTER — Other Ambulatory Visit: Payer: Self-pay | Admitting: Family Medicine

## 2017-01-02 ENCOUNTER — Ambulatory Visit (INDEPENDENT_AMBULATORY_CARE_PROVIDER_SITE_OTHER): Payer: Medicare Other

## 2017-01-02 VITALS — BP 124/70 | HR 80 | Temp 99.5°F | Ht 60.0 in | Wt 147.0 lb

## 2017-01-02 DIAGNOSIS — Z0000 Encounter for general adult medical examination without abnormal findings: Secondary | ICD-10-CM | POA: Diagnosis not present

## 2017-01-02 NOTE — Progress Notes (Signed)
Subjective:   Brittany Acevedo is a 76 y.o. female who presents for an Initial Medicare Annual Wellness Visit.  Review of Systems    N/A  Cardiac Risk Factors include: advanced age (>63men, >65 women);smoking/ tobacco exposure;dyslipidemia;hypertension     Objective:    Today's Vitals   01/02/17 1500  BP: 124/70  Pulse: 80  Temp: 99.5 F (37.5 C)  TempSrc: Oral  Weight: 147 lb (66.7 kg)  Height: 5' (1.524 m)  PainSc: 0-No pain   Body mass index is 28.71 kg/m.   Current Medications (verified) Outpatient Encounter Prescriptions as of 01/02/2017  Medication Sig  . albuterol (PROVENTIL HFA;VENTOLIN HFA) 108 (90 BASE) MCG/ACT inhaler Inhale 2 puffs into the lungs every 6 (six) hours as needed for wheezing or shortness of breath.  Marland Kitchen aspirin 325 MG tablet Take 325 mg by mouth daily.  Marland Kitchen atorvastatin (LIPITOR) 10 MG tablet Take 10 mg by mouth daily.  Marland Kitchen buPROPion (WELLBUTRIN SR) 100 MG 12 hr tablet Take 100 mg by mouth 2 (two) times daily.   . calcium-vitamin D (OSCAL) 250-125 MG-UNIT per tablet Take 1 tablet by mouth daily.  . citalopram (CELEXA) 10 MG tablet Take 3 tablets (30 mg total) by mouth daily. 1 Tablet, Oral, daily  . clonazePAM (KLONOPIN) 0.5 MG tablet Take 1 tablet (0.5 mg total) by mouth 2 (two) times daily.  . clopidogrel (PLAVIX) 75 MG tablet 1 Tablet, Oral, daily, by mouth  . donepezil (ARICEPT) 5 MG tablet Take 1 tablet (5 mg total) by mouth at bedtime.  . ferrous sulfate 325 (65 FE) MG tablet Take 1 tablet (325 mg total) by mouth 2 (two) times daily with a meal. (Patient taking differently: Take 325 mg by mouth 3 (three) times daily with meals. )  . fluticasone (FLONASE) 50 MCG/ACT nasal spray Place into the nose as needed.   . isosorbide dinitrate (ISORDIL) 30 MG tablet Take 30 mg by mouth daily.  . metoprolol tartrate (LOPRESSOR) 25 MG tablet Take 0.5 tablets (12.5 mg total) by mouth 2 (two) times daily.  . nitroGLYCERIN (NITROSTAT) 0.4 MG SL tablet Place under  the tongue.  . Omega-3 Fatty Acids (FISH OIL) 1000 MG CAPS Take by mouth.  . pantoprazole (PROTONIX) 40 MG tablet Take 1 tablet (40 mg total) by mouth daily.  . traMADol (ULTRAM) 50 MG tablet Take 1 tablet (50 mg total) by mouth every 6 (six) hours as needed.  . magnesium citrate SOLN Take 148 mLs (0.5 Bottles total) by mouth once. And repeat in 6 hours if needed. (Patient not taking: Reported on 01/02/2017)   No facility-administered encounter medications on file as of 01/02/2017.     Allergies (verified) Gabapentin; Darvon [propoxyphene]; and Shellfish allergy   History: Past Medical History:  Diagnosis Date  . Anemia   . Asthma   . Emphysema lung (HCC)   . Fibromyalgia   . GERD (gastroesophageal reflux disease)   . Heart attack 2013  . Hemorrhoids   . Hypertension   . Peripheral nervous system disorder (HCC)   . Shortness of breath dyspnea   . Sinus trouble   . Wears dentures    full upper   Past Surgical History:  Procedure Laterality Date  . ABDOMINAL HYSTERECTOMY     w/ removal of ovarian cyst  . ANGIOPLASTY  2013  . APPENDECTOMY    . cyst removed from face  2008  . PARTIAL HYSTERECTOMY  1980  . TUBAL LIGATION  1970   Family History  Problem  Relation Age of Onset  . Asthma Sister   . Heart disease Father   . Rheum arthritis Father   . Clotting disorder Sister   . Rheum arthritis Brother   . Rheum arthritis Sister   . Emphysema Cousin   . Asthma Other   . Heart disease Maternal Uncle   . Heart disease Maternal Grandmother   . Rheum arthritis Son   . Cancer Other    Social History   Occupational History  . Not on file.   Social History Main Topics  . Smoking status: Current Every Day Smoker    Packs/day: 1.00    Years: 55.00    Types: Cigarettes  . Smokeless tobacco: Never Used  . Alcohol use No  . Drug use: No  . Sexual activity: Not on file    Tobacco Counseling Ready to quit: Not Answered Counseling given: Not Answered   Activities of  Daily Living In your present state of health, do you have any difficulty performing the following activities: 01/02/2017 06/13/2016  Hearing? Y N  Vision? Y -  Difficulty concentrating or making decisions? N N  Walking or climbing stairs? Y Y  Dressing or bathing? N Y  Doing errands, shopping? N -  Preparing Food and eating ? N -  Using the Toilet? N -  In the past six months, have you accidently leaked urine? (No Data) -  Do you have problems with loss of bowel control? N -  Managing your Medications? N -  Managing your Finances? N -  Housekeeping or managing your Housekeeping? N -  Some recent data might be hidden    Immunizations and Health Maintenance Immunization History  Administered Date(s) Administered  . Pneumococcal Polysaccharide-23 02/01/2010   There are no preventive care reminders to display for this patient.  Patient Care Team: Maple Hudson., MD as PCP - General (Family Medicine) Jeralyn Ruths, MD as Consulting Physician (Oncology) Vedia Pereyra. Alvester Morin, MD as Consulting Physician (Ophthalmology)  Indicate any recent Medical Services you may have received from other than [facility] providers in the past year (date may be approximate).     Assessment:   This is a routine wellness examination for [place].   Hearing/Vision screen No exam data present  Dietary issues and exercise activities discussed: Current Exercise Habits: The patient does not participate in regular exercise at present, Exercise limited by: Other - see comments (fibromyalgia)  Goals    . Increase water intake          Recommend increasing water intake to 3 glasses of water a day.      Depression Screen PHQ 2/9 Scores 01/02/2017  PHQ - 2 Score 2  PHQ- 9 Score 16    Fall Risk Fall Risk  01/02/2017  Falls in the past year? Yes  Number falls in past yr: 1  Injury with Fall? Yes    Cognitive Function:     6CIT Screen 01/02/2017  What Year? 0 points  What month? 0 points  What  time? 0 points  Count back from 20 0 points  Months in reverse 0 points  Repeat phrase 4 points  Total Score 4    Screening Tests Health Maintenance  Topic Date Due  . INFLUENZA VACCINE  06/14/2017 (Originally 05/14/2016)  . PNA vac Low Risk Adult (2 of 2 - PCV13) 12/12/2017 (Originally 02/02/2011)  . DEXA SCAN  10/14/2026 (Originally 07/02/2005)  . TETANUS/TDAP  10/14/2026 (Originally 07/03/1960)      Plan:  I have personally reviewed and addressed the Medicare Annual Wellness questionnaire and have noted the following in the patient's chart:  A. Medical and social history B. Use of alcohol, tobacco or illicit drugs  C. Current medications and supplements D. Functional ability and status E.  Nutritional status F.  Physical activity G. Advance directives H. List of other physicians I.  Hospitalizations, surgeries, and ER visits in previous 12 months J.  Vitals K. Screenings such as hearing and vision if needed, cognitive and depression L. Referrals and appointments - none  In addition, I have reviewed and discussed with patient certain preventive protocols, quality metrics, and best practice recommendations. A written personalized care plan for preventive services as well as general preventive health recommendations were provided to patient.  See attached scanned questionnaire for additional information.   Signed,  Hyacinth Meeker, LPN Nurse Health Advisor   MD Recommendations: None. Pt declined DEXA scan, influenza vaccine, tetanus vaccine and pneumovax. Pt would like to receive her pneumovax vaccine at next visit with PCP. Referral sent to careguide for dental and vision assistance.  I have reviewed the health advisors note, was  available for consultation and I agree with documentation and plan. Julieanne Manson MD [facility]

## 2017-01-02 NOTE — Patient Instructions (Signed)
Brittany Acevedo , Thank you for taking time to come for your Medicare Wellness Visit. I appreciate your ongoing commitment to your health goals. Please review the following plan we discussed and let me know if I can assist you in the future.   Screening recommendations/referrals: Colonoscopy: due 05/12/2016 Mammogram: due 11/28/2012 Bone Density: Declined. Recommended yearly ophthalmology/optometry visit for glaucoma screening and checkup Recommended yearly dental visit for hygiene and checkup  Vaccinations: Influenza vaccine: Declined Pneumococcal vaccine: Need Pneumovax 23, declined today. Tdap vaccine: Declined Shingles vaccine: Declined   Advanced directives: Currently preparing, requested copy once done.  Conditions/risks identified: fall risk prevention  Next appointment: None, need to schedule AWV with PCP   Preventive Care 65 Years and Older, Female Preventive care refers to lifestyle choices and visits with your health care provider that can promote health and wellness. What does preventive care include?  A yearly physical exam. This is also called an annual well check.  Dental exams once or twice a year.  Routine eye exams. Ask your health care provider how often you should have your eyes checked.  Personal lifestyle choices, including:  Daily care of your teeth and gums.  Regular physical activity.  Eating a healthy diet.  Avoiding tobacco and drug use.  Limiting alcohol use.  Practicing safe sex.  Taking low-dose aspirin every day.  Taking vitamin and mineral supplements as recommended by your health care provider. What happens during an annual well check? The services and screenings done by your health care provider during your annual well check will depend on your age, overall health, lifestyle risk factors, and family history of disease. Counseling  Your health care provider may ask you questions about your:  Alcohol use.  Tobacco use.  Drug  use.  Emotional well-being.  Home and relationship well-being.  Sexual activity.  Eating habits.  History of falls.  Memory and ability to understand (cognition).  Work and work Astronomer.  Reproductive health. Screening  You may have the following tests or measurements:  Height, weight, and BMI.  Blood pressure.  Lipid and cholesterol levels. These may be checked every 5 years, or more frequently if you are over 14 years old.  Skin check.  Lung cancer screening. You may have this screening every year starting at age 81 if you have a 30-pack-year history of smoking and currently smoke or have quit within the past 15 years.  Fecal occult blood test (FOBT) of the stool. You may have this test every year starting at age 59.  Flexible sigmoidoscopy or colonoscopy. You may have a sigmoidoscopy every 5 years or a colonoscopy every 10 years starting at age 40.  Hepatitis C blood test.  Hepatitis B blood test.  Sexually transmitted disease (STD) testing.  Diabetes screening. This is done by checking your blood sugar (glucose) after you have not eaten for a while (fasting). You may have this done every 1-3 years.  Bone density scan. This is done to screen for osteoporosis. You may have this done starting at age 72.  Mammogram. This may be done every 1-2 years. Talk to your health care provider about how often you should have regular mammograms. Talk with your health care provider about your test results, treatment options, and if necessary, the need for more tests. Vaccines  Your health care provider may recommend certain vaccines, such as:  Influenza vaccine. This is recommended every year.  Tetanus, diphtheria, and acellular pertussis (Tdap, Td) vaccine. You may need a Td booster every 10  years.  Zoster vaccine. You may need this after age 73.  Pneumococcal 13-valent conjugate (PCV13) vaccine. One dose is recommended after age 42.  Pneumococcal polysaccharide  (PPSV23) vaccine. One dose is recommended after age 70. Talk to your health care provider about which screenings and vaccines you need and how often you need them. This information is not intended to replace advice given to you by your health care provider. Make sure you discuss any questions you have with your health care provider. Document Released: 10/27/2015 Document Revised: 06/19/2016 Document Reviewed: 08/01/2015 Elsevier Interactive Patient Education  2017 [place] Prevention in the Home Falls can cause injuries. They can happen to people of all ages. There are many things you can do to make your home safe and to help prevent falls. What can I do on the outside of my home?  Regularly fix the edges of walkways and driveways and fix any cracks.  Remove anything that might make you trip as you walk through a door, such as a raised step or threshold.  Trim any bushes or trees on the path to your home.  Use bright outdoor lighting.  Clear any walking paths of anything that might make someone trip, such as rocks or tools.  Regularly check to see if handrails are loose or broken. Make sure that both sides of any steps have handrails.  Any raised decks and porches should have guardrails on the edges.  Have any leaves, snow, or ice cleared regularly.  Use sand or salt on walking paths during winter.  Clean up any spills in your garage right away. This includes oil or grease spills. What can I do in the bathroom?  Use night lights.  Install grab bars by the toilet and in the tub and shower. Do not use towel bars as grab bars.  Use non-skid mats or decals in the tub or shower.  If you need to sit down in the shower, use a plastic, non-slip stool.  Keep the floor dry. Clean up any water that spills on the floor as soon as it happens.  Remove soap buildup in the tub or shower regularly.  Attach bath mats securely with double-sided non-slip rug tape.  Do not have  throw rugs and other things on the floor that can make you trip. What can I do in the bedroom?  Use night lights.  Make sure that you have a light by your bed that is easy to reach.  Do not use any sheets or blankets that are too big for your bed. They should not hang down onto the floor.  Have a firm chair that has side arms. You can use this for support while you get dressed.  Do not have throw rugs and other things on the floor that can make you trip. What can I do in the kitchen?  Clean up any spills right away.  Avoid walking on wet floors.  Keep items that you use a lot in easy-to-reach places.  If you need to reach something above you, use a strong step stool that has a grab bar.  Keep electrical cords out of the way.  Do not use floor polish or wax that makes floors slippery. If you must use wax, use non-skid floor wax.  Do not have throw rugs and other things on the floor that can make you trip. What can I do with my stairs?  Do not leave any items on the stairs.  Make sure that  there are handrails on both sides of the stairs and use them. Fix handrails that are broken or loose. Make sure that handrails are as long as the stairways.  Check any carpeting to make sure that it is firmly attached to the stairs. Fix any carpet that is loose or worn.  Avoid having throw rugs at the top or bottom of the stairs. If you do have throw rugs, attach them to the floor with carpet tape.  Make sure that you have a light switch at the top of the stairs and the bottom of the stairs. If you do not have them, ask someone to add them for you. What else can I do to help prevent falls?  Wear shoes that:  Do not have high heels.  Have rubber bottoms.  Are comfortable and fit you well.  Are closed at the toe. Do not wear sandals.  If you use a stepladder:  Make sure that it is fully opened. Do not climb a closed stepladder.  Make sure that both sides of the stepladder are  locked into place.  Ask someone to hold it for you, if possible.  Clearly mark and make sure that you can see:  Any grab bars or handrails.  First and last steps.  Where the edge of each step is.  Use tools that help you move around (mobility aids) if they are needed. These include:  Canes.  Walkers.  Scooters.  Crutches.  Turn on the lights when you go into a dark area. Replace any light bulbs as soon as they burn out.  Set up your furniture so you have a clear path. Avoid moving your furniture around.  If any of your floors are uneven, fix them.  If there are any pets around you, be aware of where they are.  Review your medicines with your doctor. Some medicines can make you feel dizzy. This can increase your chance of falling. Ask your doctor what other things that you can do to help prevent falls. This information is not intended to replace advice given to you by your health care provider. Make sure you discuss any questions you have with your health care provider. Document Released: 07/27/2009 Document Revised: 03/07/2016 Document Reviewed: 11/04/2014 Elsevier Interactive Patient Education  2017 [employer].

## 2017-01-21 ENCOUNTER — Ambulatory Visit: Payer: Medicare Other | Admitting: Family Medicine

## 2017-01-23 ENCOUNTER — Ambulatory Visit: Payer: Self-pay | Admitting: Family Medicine

## 2017-01-23 ENCOUNTER — Other Ambulatory Visit: Payer: Self-pay | Admitting: Family Medicine

## 2017-01-23 DIAGNOSIS — F0391 Unspecified dementia with behavioral disturbance: Secondary | ICD-10-CM

## 2017-01-23 DIAGNOSIS — F03918 Unspecified dementia, unspecified severity, with other behavioral disturbance: Secondary | ICD-10-CM

## 2017-01-29 ENCOUNTER — Telehealth: Payer: Self-pay | Admitting: Family Medicine

## 2017-01-29 NOTE — Telephone Encounter (Signed)
lvm for pt to return call to discuss assistance with [employer] Referral (paying for eye glasses, dental work, Catering manager. From her discussion with Ronne Binning at her [employer] Visit) - knb

## 2017-01-30 NOTE — Telephone Encounter (Signed)
Pt returned Kathryn's call.  Please return call to patient.  Thanks [employer]

## 2017-02-03 ENCOUNTER — Ambulatory Visit (INDEPENDENT_AMBULATORY_CARE_PROVIDER_SITE_OTHER): Payer: Medicare Other | Admitting: Family Medicine

## 2017-02-03 ENCOUNTER — Encounter: Payer: Self-pay | Admitting: Family Medicine

## 2017-02-03 VITALS — BP 132/70 | HR 68 | Temp 98.5°F | Resp 16 | Wt 148.0 lb

## 2017-02-03 DIAGNOSIS — Z720 Tobacco use: Secondary | ICD-10-CM | POA: Diagnosis not present

## 2017-02-03 DIAGNOSIS — F32A Depression, unspecified: Secondary | ICD-10-CM

## 2017-02-03 DIAGNOSIS — Z23 Encounter for immunization: Secondary | ICD-10-CM | POA: Diagnosis not present

## 2017-02-03 DIAGNOSIS — R413 Other amnesia: Secondary | ICD-10-CM

## 2017-02-03 DIAGNOSIS — F329 Major depressive disorder, single episode, unspecified: Secondary | ICD-10-CM

## 2017-02-03 DIAGNOSIS — D509 Iron deficiency anemia, unspecified: Secondary | ICD-10-CM

## 2017-02-03 DIAGNOSIS — R739 Hyperglycemia, unspecified: Secondary | ICD-10-CM

## 2017-02-03 LAB — POCT GLYCOSYLATED HEMOGLOBIN (HGB A1C): HEMOGLOBIN A1C: 5.9

## 2017-02-03 MED ORDER — ONETOUCH ULTRASOFT LANCETS MISC: 12 refills | Status: DC

## 2017-02-03 MED ORDER — ONETOUCH ULTRA SYSTEM W/DEVICE KIT: 1.0000 | PACK | Freq: Once | 0 refills | Status: AC

## 2017-02-03 MED ORDER — GLUCOSE BLOOD VI STRP: ORAL_STRIP | 12 refills | Status: DC

## 2017-02-03 NOTE — Progress Notes (Signed)
Subjective:  HPI Anemia- Pt was anemic at last check in January. She reports that she started taking her iron twice daily and very regularly. She feels a lot better and can tell when she does not take her iron. These labs have not been rechecked since January.   Hyperglycemia- pt last a1c was 5.9 on 10/15/16. She is not checking her blood sugar.   Memory loss- Pt reports that her memory is about the same, however now she is making sure she takes her medications and iron because she can tell when she does not take it.   Pt reports that she thinks that her nerves/depression that she is smoking more (3 packs a day) because she has lost 3 close family members recently.   Prior to Admission medications   Medication Sig Start Date End Date Taking? Authorizing Provider  albuterol (PROVENTIL HFA;VENTOLIN HFA) 108 (90 BASE) MCG/ACT inhaler Inhale 2 puffs into the lungs every 6 (six) hours as needed for wheezing or shortness of breath. 05/10/15   Margarita Rana, MD  aspirin 325 MG tablet Take 325 mg by mouth daily.    Historical Provider, MD  atorvastatin (LIPITOR) 10 MG tablet Take 10 mg by mouth daily.    Historical Provider, MD  buPROPion (WELLBUTRIN SR) 100 MG 12 hr tablet Take 100 mg by mouth 2 (two) times daily.  08/29/14   Historical Provider, MD  calcium-vitamin D (OSCAL) 250-125 MG-UNIT per tablet Take 1 tablet by mouth daily.    Historical Provider, MD  citalopram (CELEXA) 10 MG tablet Take 3 tablets (30 mg total) by mouth daily. 1 Tablet, Oral, daily 09/26/16   Jerrol Banana., MD  clonazePAM (KLONOPIN) 0.5 MG tablet Take 1 tablet (0.5 mg total) by mouth 2 (two) times daily. 10/09/16   Mar Daring, PA-C  clopidogrel (PLAVIX) 75 MG tablet 1 Tablet, Oral, daily, by mouth 10/22/16   Jerrol Banana., MD  donepezil (ARICEPT) 5 MG tablet Take 1 tablet (5 mg total) by mouth at bedtime. 01/25/17   Richard Maceo Pro., MD  ferrous sulfate 325 (65 FE) MG tablet Take 1 tablet (325 mg  total) by mouth 2 (two) times daily with a meal. Patient taking differently: Take 325 mg by mouth 3 (three) times daily with meals.  06/06/16   Richard Maceo Pro., MD  fluticasone (FLONASE) 50 MCG/ACT nasal spray Place into the nose as needed.  06/08/14   Historical Provider, MD  isosorbide dinitrate (ISORDIL) 30 MG tablet Take 30 mg by mouth daily.    Historical Provider, MD  isosorbide mononitrate (IMDUR) 30 MG 24 hr tablet Take 1 tablet (30 mg total) by mouth daily. 01/25/17   Richard Maceo Pro., MD  magnesium citrate SOLN Take 148 mLs (0.5 Bottles total) by mouth once. And repeat in 6 hours if needed. Patient not taking: Reported on 01/02/2017 05/09/15   Margarita Rana, MD  metoprolol tartrate (LOPRESSOR) 25 MG tablet Take 0.5 tablets (12.5 mg total) by mouth 2 (two) times daily. 12/02/16   Richard Maceo Pro., MD  nitroGLYCERIN (NITROSTAT) 0.4 MG SL tablet Place under the tongue.    Historical Provider, MD  Omega-3 Fatty Acids (FISH OIL) 1000 MG CAPS Take by mouth.    Historical Provider, MD  pantoprazole (PROTONIX) 40 MG tablet Take 1 tablet (40 mg total) by mouth daily. 10/22/16   Richard Maceo Pro., MD  traMADol (ULTRAM) 50 MG tablet Take 1 tablet (50 mg total) by mouth  every 6 (six) hours as needed. 09/26/16   Richard Maceo Pro., MD    Patient Active Problem List   Diagnosis Date Noted  . Personal history of tobacco use, presenting hazards to health 10/15/2016  . Chronic pain 09/26/2016  . Hyperglycemia 05/09/2015  . Pelvic pain in female 05/09/2015  . Constipation 05/09/2015  . Anxiety 04/22/2015  . Autoimmune disease ([place]) 04/22/2015  . Body mass index (BMI) of 28.0-28.9 in adult 04/22/2015  . Atherosclerosis of coronary artery 04/22/2015  . Chronic headache 04/22/2015  . Dementia 04/22/2015  . Clinical depression 04/22/2015  . Dizziness 04/22/2015  . Family history of diabetes mellitus 04/22/2015  . Broken rib 04/22/2015  . Acid reflux 04/22/2015  . Cannot sleep  04/22/2015  . Amnesia 04/22/2015  . Cramp in muscle 04/22/2015  . Excessive urination at night 04/22/2015  . Decreased potassium in the blood 04/22/2015  . Excessive falling 04/22/2015  . Pain in rib 04/22/2015  . History of biliary T-tube placement 04/22/2015  . Iron deficiency anemia 06/06/2014  . Anemia 05/02/2014  . Dyspnea 04/11/2014  . Tobacco abuse 04/11/2014  . 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 Fibrositis 03/16/2014  . Heart attack ([place]) 03/16/2014  . Hypercholesteremia 04/15/2006    Past Medical History:  Diagnosis Date  . Anemia   . Asthma   . Emphysema lung ([place])   . Fibromyalgia   . GERD (gastroesophageal reflux disease)   . Heart attack ([place]) 2013  . Hemorrhoids   . Hypertension   . Peripheral nervous system disorder   . Shortness of breath dyspnea   . Sinus trouble   . Wears dentures    full upper    Social History   Social History  . Marital status: Married    Spouse name: N/A  . Number of children: N/A  . Years of education: N/A   Occupational History  . Not on file.   Social History Main Topics  . Smoking status: Current Every Day Smoker    Packs/day: 3.00    Years: 55.00    Types: Cigarettes  . Smokeless tobacco: Never Used  . Alcohol use No  . Drug use: No  . Sexual activity: Not on file   Other Topics Concern  . Not on file   Social History Narrative  . No narrative on file    Allergies  Allergen Reactions  . Gabapentin Anaphylaxis    Throat closes  . Darvon [Propoxyphene]     hives  . Shellfish Allergy     "causes problems with my fibromyalgia"    Review of Systems  Constitutional: Positive for malaise/fatigue.  HENT: Negative.   Eyes: Negative.   Respiratory: Positive for shortness of breath.   Cardiovascular: Negative.   Gastrointestinal: Negative.   Genitourinary: Negative.   Musculoskeletal: Positive for back pain.  Skin: Negative.   Neurological: Negative.     Endo/Heme/Allergies: Negative.   Psychiatric/Behavioral: Negative.     Immunization History  Administered Date(s) Administered  . Pneumococcal Polysaccharide-23 02/01/2010    Objective:  BP 132/70 (BP Location: Left Arm, Patient Position: Sitting, Cuff Size: Normal)   Pulse 68   Temp 98.5 F (36.9 C) (Oral)   Resp 16   Wt 148 lb (67.1 kg)   SpO2 96%   BMI 28.90 kg/m   Physical Exam  Constitutional: She is oriented to person, place, and time and well-developed, well-nourished, and in no distress.  HENT:  Head: Normocephalic and atraumatic.  Eyes:  Conjunctivae and EOM are normal. Pupils are equal, round, and reactive to light.  Neck: Normal range of motion. Neck supple.  Cardiovascular: Normal rate, regular rhythm, normal heart sounds and intact distal pulses.   Pulmonary/Chest: Effort normal and breath sounds normal.  Abdominal: Soft.  Musculoskeletal: Normal range of motion.  Neurological: She is alert and oriented to person, place, and time. She has normal reflexes. Gait normal. GCS score is 15.  Skin: Skin is warm and dry.  Psychiatric: Mood, memory, affect and judgment normal.    Lab Results  Component Value Date   WBC 12.9 (H) 10/15/2016   HGB 11.1 (A) 11/22/2014   HCT 31.5 (L) 10/15/2016   PLT 553 (H) 10/15/2016   GLUCOSE 108 (H) 10/15/2016   CHOL 243 (H) 05/20/2016   TRIG 111 05/20/2016   HDL 38 (L) 05/20/2016   LDLCALC 183 (H) 05/20/2016   TSH 2.160 10/15/2016   INR 1.1 03/20/2014   HGBA1C 5.9 02/03/2017    CMP     Component Value Date/Time   NA 136 10/15/2016 1440   NA 136 03/20/2014 2232   K 4.2 10/15/2016 1440   K 3.8 03/20/2014 2232   CL 94 (L) 10/15/2016 1440   CL 105 03/20/2014 2232   CO2 26 10/15/2016 1440   CO2 25 03/20/2014 2232   GLUCOSE 108 (H) 10/15/2016 1440   GLUCOSE 86 03/20/2014 2232   BUN 9 10/15/2016 1440   BUN 10 03/20/2014 2232   CREATININE 0.71 10/15/2016 1440   CREATININE 0.86 03/20/2014 2232   CALCIUM 9.2 10/15/2016  1440   CALCIUM 8.3 (L) 03/20/2014 2232   PROT 7.4 10/15/2016 1440   ALBUMIN 3.9 10/15/2016 1440   AST 20 10/15/2016 1440   ALT 18 10/15/2016 1440   ALKPHOS 54 10/15/2016 1440   BILITOT <0.2 10/15/2016 1440   GFRNONAA 83 10/15/2016 1440   GFRNONAA >60 03/20/2014 2232   GFRAA 96 10/15/2016 1440   GFRAA >60 03/20/2014 2232    Assessment and Plan :  1. Hyperglycemia  - POCT HgB A1C 5.9 today. Stable.  - Blood Glucose Monitoring Suppl (ONE TOUCH ULTRA SYSTEM KIT) w/Device KIT; 1 kit by Does not apply route once.  Dispense: 1 each; Refill: 0 - glucose blood test strip; Use as instructed  Dispense: 100 each; Refill: 12 - Lancets (ONETOUCH ULTRASOFT) lancets; Use as instructed  Dispense: 100 each; Refill: 12  2. Depression, unspecified depression type   3. Tobacco abuse Strongly encouraged to stop smoking.   4. Iron deficiency anemia, unspecified iron deficiency anemia type  - CBC with Differential/Platelet - Iron - Ambulatory referral to Cardiology  5. Memory loss   6. Need for pneumococcal vaccination  - Pneumococcal conjugate vaccine 13-valent IM 7.ASCVD Pt again referred to cardiology which she has refused to keep in past then she needs GI w/u for anemia. 8.SK of hand HPI, Exam, and A&P Transcribed under the direction and in the presence of Richard L. Cranford Mon, MD  Electronically Signed: Katina Dung, CMA I have done the exam and reviewed the above chart and it is accurate to the best of my knowledge. Development worker, community has been used in this note in any air is in the dictation or transcription are unintentional.  [facility] 02/03/2017 3:05 PM

## 2017-02-04 LAB — CBC WITH DIFFERENTIAL/PLATELET
BASOS: 1 %
Basophils Absolute: 0.1 10*3/uL (ref 0.0–0.2)
EOS (ABSOLUTE): 0.4 10*3/uL (ref 0.0–0.4)
Eos: 4 %
HEMOGLOBIN: 13.4 g/dL (ref 11.1–15.9)
Hematocrit: 39 % (ref 34.0–46.6)
IMMATURE GRANS (ABS): 0 10*3/uL (ref 0.0–0.1)
Immature Granulocytes: 0 %
LYMPHS ABS: 3.5 10*3/uL — AB (ref 0.7–3.1)
LYMPHS: 29 %
MCH: 29.1 pg (ref 26.6–33.0)
MCHC: 34.4 g/dL (ref 31.5–35.7)
MCV: 85 fL (ref 79–97)
MONOCYTES: 10 %
Monocytes Absolute: 1.2 10*3/uL — ABNORMAL HIGH (ref 0.1–0.9)
NEUTROS ABS: 7 10*3/uL (ref 1.4–7.0)
Neutrophils: 56 %
Platelets: 406 10*3/uL — ABNORMAL HIGH (ref 150–379)
RBC: 4.61 x10E6/uL (ref 3.77–5.28)
RDW: 17.1 % — ABNORMAL HIGH (ref 12.3–15.4)
WBC: 12.2 10*3/uL — AB (ref 3.4–10.8)

## 2017-02-04 LAB — IRON: Iron: 39 ug/dL (ref 27–139)

## 2017-02-04 NOTE — Progress Notes (Signed)
Advised  ED 

## 2017-02-11 ENCOUNTER — Telehealth: Payer: Self-pay | Admitting: Family Medicine

## 2017-02-11 NOTE — Telephone Encounter (Signed)
2nd call to discuss Community Resources from her AWV with mckenzie left my work cell phone and also the BFP # - knb

## 2017-02-18 ENCOUNTER — Telehealth: Payer: Self-pay | Admitting: Family Medicine

## 2017-02-18 NOTE — Telephone Encounter (Signed)
Pt wants to know why she is being referred to another heart specialist when she already sees Dr. Evette Georges.  Pt said someone called her and said that Dr. Reece Agar was referring her to a heart clinic.  Pt's call back [phone]  Thanks teri

## 2017-02-18 NOTE — Telephone Encounter (Signed)
See Dr Darrold Junker then.

## 2017-02-18 NOTE — Telephone Encounter (Signed)
[  please review-aa 

## 2017-02-19 ENCOUNTER — Telehealth: Payer: Self-pay | Admitting: Family Medicine

## 2017-02-19 NOTE — Telephone Encounter (Signed)
lmtcb-aa 

## 2017-02-19 NOTE — Telephone Encounter (Signed)
Called Pt to discuss community resources for items from AWV w/Mckenzie

## 2017-02-20 ENCOUNTER — Ambulatory Visit: Payer: Self-pay | Admitting: Cardiology

## 2017-02-21 NOTE — Telephone Encounter (Signed)
Pt advised-aa 

## 2017-03-14 NOTE — Telephone Encounter (Signed)
Pt returned call on Thursday, called on 6/1 and lvm with my direct # [phone] -knb

## 2017-03-19 ENCOUNTER — Other Ambulatory Visit: Payer: Self-pay | Admitting: Family Medicine

## 2017-03-19 DIAGNOSIS — G8929 Other chronic pain: Secondary | ICD-10-CM

## 2017-03-19 MED ORDER — TRAMADOL HCL 50 MG PO TABS: 50.0000 mg | ORAL_TABLET | Freq: Four times a day (QID) | ORAL | 4 refills | Status: DC | PRN

## 2017-03-19 NOTE — Telephone Encounter (Signed)
[facility] faxed a  refill request on the following medications:  traMADol (ULTRAM) 50 MG tablet.  120 TAB.  Take 1 tablet every 6 hours as needed.  [facility]  This is a pt of Dr Sullivan Lone.

## 2017-03-19 NOTE — Telephone Encounter (Signed)
Rx called in to pharmacy. 

## 2017-03-19 NOTE — Telephone Encounter (Signed)
Please call in tramadol.  

## 2017-03-21 ENCOUNTER — Telehealth: Payer: Self-pay | Admitting: Family Medicine

## 2017-03-21 NOTE — Telephone Encounter (Signed)
Lm for pt to c/b direct # [phone] to discuss dental, eyeglasses and counseling referral - knb

## 2017-04-30 ENCOUNTER — Other Ambulatory Visit: Payer: Self-pay | Admitting: Physician Assistant

## 2017-04-30 ENCOUNTER — Other Ambulatory Visit: Payer: Self-pay | Admitting: Family Medicine

## 2017-04-30 DIAGNOSIS — F419 Anxiety disorder, unspecified: Secondary | ICD-10-CM

## 2017-04-30 MED ORDER — CLONAZEPAM 0.5 MG PO TABS: 0.5000 mg | ORAL_TABLET | Freq: Two times a day (BID) | ORAL | 0 refills | Status: DC

## 2017-04-30 NOTE — Telephone Encounter (Signed)
[facility] faxed a request on the following medication. Thanks CC  clonazePAM (KLONOPIN) 0.5 MG tablet  >Take 1 tablet twice a day.

## 2017-05-01 NOTE — Telephone Encounter (Signed)
rx called in-aa 

## 2017-05-05 ENCOUNTER — Ambulatory Visit: Payer: Medicare Other | Admitting: Family Medicine

## 2017-05-28 ENCOUNTER — Other Ambulatory Visit: Payer: Self-pay | Admitting: Family Medicine

## 2017-05-28 DIAGNOSIS — F419 Anxiety disorder, unspecified: Secondary | ICD-10-CM

## 2017-05-28 MED ORDER — CLONAZEPAM 0.5 MG PO TABS: 0.5000 mg | ORAL_TABLET | Freq: Two times a day (BID) | ORAL | 0 refills | Status: DC

## 2017-05-28 NOTE — Telephone Encounter (Signed)
Last ov 02/03/17 Last filled 04/30/17 Please review. Thank you. sd

## 2017-05-28 NOTE — Telephone Encounter (Signed)
[facility] faxed a refill request on the following medications:  clonazePAM (KLONOPIN) 0.5 MG tablet.  Take 1 tablet twice a day.    [facility]

## 2017-06-25 ENCOUNTER — Other Ambulatory Visit: Payer: Self-pay | Admitting: Family Medicine

## 2017-06-25 DIAGNOSIS — F419 Anxiety disorder, unspecified: Secondary | ICD-10-CM

## 2017-06-25 MED ORDER — CLONAZEPAM 0.5 MG PO TABS: 0.5000 mg | ORAL_TABLET | Freq: Two times a day (BID) | ORAL | 0 refills | Status: DC

## 2017-06-25 NOTE — Telephone Encounter (Signed)
[facility] faxed a request for the following medication. Thanks CC  clonazePAM (KLONOPIN) 0.5 MG tablet  >Take 1 tablet twice a day.

## 2017-07-07 ENCOUNTER — Ambulatory Visit: Payer: Medicare Other | Admitting: Family Medicine

## 2017-07-10 ENCOUNTER — Ambulatory Visit (INDEPENDENT_AMBULATORY_CARE_PROVIDER_SITE_OTHER): Payer: Medicare Other | Admitting: Family Medicine

## 2017-07-10 ENCOUNTER — Encounter: Payer: Self-pay | Admitting: Family Medicine

## 2017-07-10 VITALS — BP 110/62 | HR 62 | Temp 99.3°F | Resp 18 | Wt 147.0 lb

## 2017-07-10 DIAGNOSIS — R079 Chest pain, unspecified: Secondary | ICD-10-CM | POA: Diagnosis not present

## 2017-07-10 DIAGNOSIS — R0789 Other chest pain: Secondary | ICD-10-CM

## 2017-07-10 DIAGNOSIS — F419 Anxiety disorder, unspecified: Secondary | ICD-10-CM | POA: Diagnosis not present

## 2017-07-10 DIAGNOSIS — F329 Major depressive disorder, single episode, unspecified: Secondary | ICD-10-CM | POA: Diagnosis not present

## 2017-07-10 DIAGNOSIS — Z720 Tobacco use: Secondary | ICD-10-CM | POA: Diagnosis not present

## 2017-07-10 DIAGNOSIS — R739 Hyperglycemia, unspecified: Secondary | ICD-10-CM

## 2017-07-10 DIAGNOSIS — D508 Other iron deficiency anemias: Secondary | ICD-10-CM

## 2017-07-10 DIAGNOSIS — F32A Depression, unspecified: Secondary | ICD-10-CM

## 2017-07-10 DIAGNOSIS — I25708 Atherosclerosis of coronary artery bypass graft(s), unspecified, with other forms of angina pectoris: Secondary | ICD-10-CM

## 2017-07-10 DIAGNOSIS — E7800 Pure hypercholesterolemia, unspecified: Secondary | ICD-10-CM | POA: Diagnosis not present

## 2017-07-10 LAB — POCT GLYCOSYLATED HEMOGLOBIN (HGB A1C)
Est. average glucose Bld gHb Est-mCnc: 128
Hemoglobin A1C: 6.1

## 2017-07-10 MED ORDER — CLONAZEPAM 0.5 MG PO TABS: 0.5000 mg | ORAL_TABLET | Freq: Two times a day (BID) | ORAL | 5 refills | Status: DC

## 2017-07-10 MED ORDER — CITALOPRAM HYDROBROMIDE 10 MG PO TABS: 30.0000 mg | ORAL_TABLET | Freq: Every day | ORAL | 11 refills | Status: DC

## 2017-07-10 NOTE — Progress Notes (Signed)
Patient: Brittany Acevedo Female    DOB: 01/25/1941   76 y.o.   MRN: [id number] Visit Date: 07/10/2017  Today's Provider: Megan Mans, MD   Chief Complaint  Patient presents with  . Hyperglycemia    follow up  . Depression    follow up  . Anemia    follow up   Subjective:    Depression         This is a chronic problem.  The problem has been gradually worsening since onset.  Associated symptoms include no fatigue and no appetite change.  Compliance with treatment is poor (Has been out of medication for the past 3 weeks). Anemia  Presents for follow-up visit. There has been no abdominal pain, fever or palpitations. There are no compliance problems.     Hyperglycemia, Follow-up:   Lab Results  Component Value Date   HGBA1C 5.9 02/03/2017   HGBA1C 5.9 (H) 10/15/2016   HGBA1C 6.0 11/22/2014   GLUCOSE 108 (H) 10/15/2016   GLUCOSE 101 (H) 05/20/2016   GLUCOSE 97 05/11/2015    Last seen for for this 5 months ago.  Management since then includes no changes; patient was given a glucose meter to check blood sugars at home. Patient states she has not been checking her blood sugars because she is unsure how to use the machine. Current symptoms include polyuria and visual disturbances and have been stable.  Weight trend: stable Prior visit with dietician: no Current diet: in general, an "unhealthy" diet Current exercise: none  Pertinent Labs:    Component Value Date/Time   CHOL 243 (H) 05/20/2016 1028   TRIG 111 05/20/2016 1028   CHOLHDL 6.4 (H) 05/20/2016 1028   CREATININE 0.71 10/15/2016 1440   CREATININE 0.86 03/20/2014 2232    Wt Readings from Last 3 Encounters:  02/03/17 148 lb (67.1 kg)  01/02/17 147 lb (66.7 kg)  10/15/16 145 lb (65.8 kg)      Allergies  Allergen Reactions  . Gabapentin Anaphylaxis    Throat closes  . Darvon [Propoxyphene]     hives  . Shellfish Allergy     "causes problems with my fibromyalgia"     Current Outpatient  Prescriptions:  .  albuterol (PROVENTIL HFA;VENTOLIN HFA) 108 (90 BASE) MCG/ACT inhaler, Inhale 2 puffs into the lungs every 6 (six) hours as needed for wheezing or shortness of breath., Disp: 18 g, Rfl: 3 .  aspirin 325 MG tablet, Take 325 mg by mouth daily., Disp: , Rfl:  .  atorvastatin (LIPITOR) 10 MG tablet, Take 10 mg by mouth daily., Disp: , Rfl:  .  buPROPion (WELLBUTRIN SR) 100 MG 12 hr tablet, Take 100 mg by mouth 2 (two) times daily. , Disp: , Rfl:  .  clopidogrel (PLAVIX) 75 MG tablet, 1 Tablet, Oral, daily, by mouth, Disp: 90 tablet, Rfl: 0 .  ferrous sulfate 325 (65 FE) MG tablet, Take 1 tablet (325 mg total) by mouth 2 (two) times daily with a meal. (Patient taking differently: Take 325 mg by mouth 3 (three) times daily with meals. ), Disp: 60 tablet, Rfl: 5 .  fluticasone (FLONASE) 50 MCG/ACT nasal spray, Place into the nose as needed. , Disp: , Rfl:  .  glucose blood test strip, Use as instructed, Disp: 100 each, Rfl: 12 .  isosorbide dinitrate (ISORDIL) 30 MG tablet, Take 30 mg by mouth daily., Disp: , Rfl:  .  isosorbide mononitrate (IMDUR) 30 MG 24 hr tablet, Take 1 tablet (  30 mg total) by mouth daily., Disp: 90 tablet, Rfl: 0 .  Lancets (ONETOUCH ULTRASOFT) lancets, Use as instructed, Disp: 100 each, Rfl: 12 .  magnesium citrate SOLN, Take 148 mLs (0.5 Bottles total) by mouth once. And repeat in 6 hours if needed., Disp: 195 mL, Rfl: 1 .  metoprolol tartrate (LOPRESSOR) 25 MG tablet, Take 0.5 tablets (12.5 mg total) by mouth 2 (two) times daily., Disp: 180 tablet, Rfl: 1 .  nitroGLYCERIN (NITROSTAT) 0.4 MG SL tablet, Place under the tongue., Disp: , Rfl:  .  Omega-3 Fatty Acids (FISH OIL) 1000 MG CAPS, Take by mouth., Disp: , Rfl:  .  pantoprazole (PROTONIX) 40 MG tablet, Take 1 tablet (40 mg total) by mouth daily., Disp: 90 tablet, Rfl: 1 .  traMADol (ULTRAM) 50 MG tablet, Take 1 tablet (50 mg total) by mouth every 6 (six) hours as needed., Disp: 120 tablet, Rfl: 4 .   calcium-vitamin D (OSCAL) 250-125 MG-UNIT per tablet, Take 1 tablet by mouth daily., Disp: , Rfl:  .  citalopram (CELEXA) 10 MG tablet, Take 3 tablets (30 mg total) by mouth daily. 1 Tablet, Oral, daily (Patient not taking: Reported on 07/10/2017), Disp: 90 tablet, Rfl: 11 .  clonazePAM (KLONOPIN) 0.5 MG tablet, Take 1 tablet (0.5 mg total) by mouth 2 (two) times daily. (Patient not taking: Reported on 07/10/2017), Disp: 60 tablet, Rfl: 0 .  donepezil (ARICEPT) 5 MG tablet, Take 1 tablet (5 mg total) by mouth at bedtime. (Patient not taking: Reported on 07/10/2017), Disp: 90 tablet, Rfl: 3  Review of Systems  Constitutional: Negative for appetite change, chills, fatigue and fever.  HENT: Negative.   Eyes: Positive for visual disturbance (blurred vision).  Respiratory: Positive for shortness of breath. Negative for chest tightness.   Cardiovascular: Positive for chest pain. Negative for palpitations.  Gastrointestinal: Negative for abdominal pain, nausea and vomiting.  Endocrine: Negative.   Allergic/Immunologic: Negative.   Neurological: Negative for dizziness and weakness.  Psychiatric/Behavioral: Positive for depression.    Social History  Substance Use Topics  . Smoking status: Current Every Day Smoker    Packs/day: 3.00    Years: 55.00    Types: Cigarettes  . Smokeless tobacco: Never Used  . Alcohol use No   Objective:   BP 110/62 (BP Location: Left Arm, Patient Position: Sitting, Cuff Size: Large)   Pulse 62   Temp 99.3 F (37.4 C) (Oral)   Resp 18   Wt 147 lb (66.7 kg)   SpO2 96% Comment: room air  BMI 28.71 kg/m  There were no vitals filed for this visit.   Physical Exam  Constitutional: She is oriented to person, place, and time. She appears well-developed and well-nourished.  HENT:  Head: Normocephalic and atraumatic.  Right Ear: External ear normal.  Left Ear: External ear normal.  Nose: Nose normal.  Eyes: Conjunctivae are normal.  Cardiovascular: Normal rate  and normal heart sounds.   Pulmonary/Chest: Effort normal and breath sounds normal.  Abdominal: Soft.  Lymphadenopathy:    She has no cervical adenopathy.  Neurological: She is alert and oriented to person, place, and time.  Skin: Skin is warm and dry.  Psychiatric: She has a normal mood and affect. Her behavior is normal. Judgment and thought content normal.        Assessment & Plan:     1. Other iron deficiency anemia Will recheck labs. - CBC - Iron  2. Hyperglycemia - POCT HgB A1C - Comprehensive Metabolic Panel (CMET) - CBC  3. Depression, unspecified depression type Worsened. Will refill medications (Citalopram and Clonazepam). Patient has been out of medication for the past 3 weeks.  - citalopram (CELEXA) 10 MG tablet; Take 3 tablets (30 mg total) by mouth daily. 1 Tablet, Oral, daily  Dispense: 90 tablet; Refill: 11  4. Chest pain, unspecified type -Will refer to Cardiology.   5. Atypical chest pain - Ambulatory referral to Cardiology  6. 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 Tobacco abuse -recommend smoking cessation.  8. Anxiety - clonazePAM (KLONOPIN) 0.5 MG tablet; Take 1 tablet (0.5 mg total) by mouth 2 (two) times daily.  Dispense: 60 tablet; Refill: 5  9. Hypercholesteremia - Lipid Profile - CBC 10. Noncompliance with medical advice. This is the fourth time patient has been advised to follow-up with cardiology. She did not undergo a GI workup because they refused to go without cardiology clearance.     I have done the exam and reviewed the above chart and it is accurate to the best of my knowledge. Dentist has been used in this note in any air is in the dictation or transcription are unintentional. I have done the exam and reviewed the chart and it is accurate to the best of my knowledge. Forensic scientist has been used and  any errors in dictation or transcription are unintentional. Julieanne Manson M.D. [facility]  Megan Mans, MD  [facility]

## 2017-07-14 DIAGNOSIS — I25110 Atherosclerotic heart disease of native coronary artery with unstable angina pectoris: Secondary | ICD-10-CM | POA: Diagnosis not present

## 2017-07-14 DIAGNOSIS — R0789 Other chest pain: Secondary | ICD-10-CM | POA: Diagnosis not present

## 2017-07-14 DIAGNOSIS — Z9889 Other specified postprocedural states: Secondary | ICD-10-CM | POA: Diagnosis not present

## 2017-07-14 DIAGNOSIS — Z720 Tobacco use: Secondary | ICD-10-CM | POA: Diagnosis not present

## 2017-07-14 DIAGNOSIS — R0602 Shortness of breath: Secondary | ICD-10-CM | POA: Diagnosis not present

## 2017-07-28 ENCOUNTER — Other Ambulatory Visit: Payer: Self-pay | Admitting: Family Medicine

## 2017-07-29 ENCOUNTER — Other Ambulatory Visit: Payer: Self-pay | Admitting: Family Medicine

## 2017-07-29 MED ORDER — ATORVASTATIN CALCIUM 10 MG PO TABS: 10.0000 mg | ORAL_TABLET | Freq: Every day | ORAL | 3 refills | Status: DC

## 2017-07-29 NOTE — Telephone Encounter (Signed)
[facility] faxed a refill request on the following medications:  atorvastatin (LIPITOR) 10 MG tablet.  1 tablet oral daily.  90 day supply.    [facility]  This is a Dr Sullivan Lone patient.

## 2017-08-20 ENCOUNTER — Other Ambulatory Visit: Payer: Self-pay

## 2017-08-20 DIAGNOSIS — G8929 Other chronic pain: Secondary | ICD-10-CM

## 2017-08-20 NOTE — Telephone Encounter (Signed)
Pharmacy requesting refills. Thanks!  

## 2017-08-21 MED ORDER — TRAMADOL HCL 50 MG PO TABS: 50.0000 mg | ORAL_TABLET | Freq: Four times a day (QID) | ORAL | 3 refills | Status: DC | PRN

## 2017-09-05 ENCOUNTER — Other Ambulatory Visit: Payer: Self-pay | Admitting: Family Medicine

## 2017-09-16 ENCOUNTER — Other Ambulatory Visit: Payer: Self-pay | Admitting: Family Medicine

## 2017-09-16 DIAGNOSIS — G8929 Other chronic pain: Secondary | ICD-10-CM

## 2017-09-16 NOTE — Telephone Encounter (Signed)
Pharmacy requesting refills. Thanks!  

## 2017-09-19 NOTE — Telephone Encounter (Signed)
rx called in-[facility] Toohey V Mcadoo Muzquiz, RMA  

## 2017-10-15 ENCOUNTER — Telehealth: Payer: Self-pay | Admitting: *Deleted

## 2017-10-15 DIAGNOSIS — Z122 Encounter for screening for malignant neoplasm of respiratory organs: Secondary | ICD-10-CM

## 2017-10-15 DIAGNOSIS — Z87891 Personal history of nicotine dependence: Secondary | ICD-10-CM

## 2017-10-15 NOTE — Telephone Encounter (Signed)
Notified patient that annual lung cancer screening low dose CT scan is due currently or will be in near future. Confirmed that patient is within the age range of 55-77, and asymptomatic, (no signs or symptoms of lung cancer). Patient denies illness that would prevent curative treatment for lung cancer if found. Verified smoking history, (current, 55.75 pack year). The shared decision making visit was done 10/15/16. Patient is agreeable for CT scan being scheduled.

## 2017-10-23 ENCOUNTER — Other Ambulatory Visit: Payer: Self-pay | Admitting: Family Medicine

## 2017-10-28 ENCOUNTER — Ambulatory Visit: Admission: RE | Admit: 2017-10-28 | Payer: Medicare Other | Source: Ambulatory Visit

## 2017-11-06 ENCOUNTER — Ambulatory Visit: Payer: Medicare Other | Attending: Nurse Practitioner

## 2017-11-08 ENCOUNTER — Other Ambulatory Visit: Payer: Self-pay | Admitting: Family Medicine

## 2017-11-13 ENCOUNTER — Ambulatory Visit: Admission: RE | Admit: 2017-11-13 | Payer: Medicare Other | Source: Ambulatory Visit

## 2017-11-20 ENCOUNTER — Ambulatory Visit
Admission: RE | Admit: 2017-11-20 | Discharge: 2017-11-20 | Disposition: A | Discharge status: Home / Self Care | Payer: Medicare Other | Source: Ambulatory Visit | Attending: Nurse Practitioner | Admitting: Nurse Practitioner

## 2017-11-20 DIAGNOSIS — I700 Atherosclerosis of aorta: Secondary | ICD-10-CM | POA: Insufficient documentation

## 2017-11-20 DIAGNOSIS — Z87891 Personal history of nicotine dependence: Secondary | ICD-10-CM | POA: Diagnosis not present

## 2017-11-20 DIAGNOSIS — Z122 Encounter for screening for malignant neoplasm of respiratory organs: Secondary | ICD-10-CM | POA: Insufficient documentation

## 2017-11-20 DIAGNOSIS — N289 Disorder of kidney and ureter, unspecified: Secondary | ICD-10-CM | POA: Insufficient documentation

## 2017-11-20 DIAGNOSIS — J439 Emphysema, unspecified: Secondary | ICD-10-CM | POA: Diagnosis not present

## 2017-11-20 DIAGNOSIS — J849 Interstitial pulmonary disease, unspecified: Secondary | ICD-10-CM | POA: Insufficient documentation

## 2017-11-20 DIAGNOSIS — I2510 Atherosclerotic heart disease of native coronary artery without angina pectoris: Secondary | ICD-10-CM | POA: Diagnosis not present

## 2017-11-24 ENCOUNTER — Encounter: Payer: Self-pay | Admitting: *Deleted

## 2017-12-24 ENCOUNTER — Other Ambulatory Visit: Payer: Self-pay | Admitting: Family Medicine

## 2017-12-24 DIAGNOSIS — F419 Anxiety disorder, unspecified: Secondary | ICD-10-CM

## 2017-12-24 MED ORDER — CLONAZEPAM 0.5 MG PO TABS: 0.5000 mg | ORAL_TABLET | Freq: Two times a day (BID) | ORAL | 0 refills | Status: DC

## 2017-12-24 NOTE — Telephone Encounter (Signed)
[facility] faxed refill request for following medications:  clonazePAM (KLONOPIN) 0.5 MG tablet      Please advise,Thanks Parchment

## 2018-01-07 ENCOUNTER — Ambulatory Visit (INDEPENDENT_AMBULATORY_CARE_PROVIDER_SITE_OTHER): Payer: Medicare Other | Admitting: Family Medicine

## 2018-01-07 ENCOUNTER — Encounter: Payer: Self-pay | Admitting: Family Medicine

## 2018-01-07 VITALS — BP 120/70 | HR 70 | Temp 99.3°F | Resp 16 | Ht 60.0 in | Wt 146.0 lb

## 2018-01-07 DIAGNOSIS — I25708 Atherosclerosis of coronary artery bypass graft(s), unspecified, with other forms of angina pectoris: Secondary | ICD-10-CM | POA: Diagnosis not present

## 2018-01-07 DIAGNOSIS — D508 Other iron deficiency anemias: Secondary | ICD-10-CM

## 2018-01-07 DIAGNOSIS — F419 Anxiety disorder, unspecified: Secondary | ICD-10-CM

## 2018-01-07 DIAGNOSIS — Z720 Tobacco use: Secondary | ICD-10-CM | POA: Diagnosis not present

## 2018-01-07 DIAGNOSIS — R739 Hyperglycemia, unspecified: Secondary | ICD-10-CM

## 2018-01-07 MED ORDER — NITROGLYCERIN 0.4 MG SL SUBL: 0.4000 mg | SUBLINGUAL_TABLET | SUBLINGUAL | 11 refills | Status: AC | PRN

## 2018-01-07 NOTE — Patient Instructions (Addendum)
Stop clonazepam.  Labs: CBC Hemoglobin A1c CK Iron TSH CMET  Referral to Cardiology Marcina Millard, MD

## 2018-01-07 NOTE — Progress Notes (Signed)
Patient: Brittany Acevedo Female    DOB: 08-11-1941   77 y.o.   MRN: [id number] Visit Date: 01/07/2018  Today's Provider: Megan Mans, MD   Chief Complaint  Patient presents with  . Follow-up  . Anxiety  . Hyperglycemia  . Anemia   Subjective:    Patient has been having leg pain when she walks. Shortness of breath.    Other iron deficiency anemia From 07/10/2017-labs ordered, zero done.   Hyperglycemia From 07/10/2017-labs ordered, zero done.  Depression, unspecified depression type From 07/10/2017-refilled Worsened Citalopram and Clonazepam.  Anxiety From 07/10/2017-refilled clonazePAM (KLONOPIN) 0.5 MG tablet.    Allergies  Allergen Reactions  . Gabapentin Anaphylaxis    Throat closes  . Darvon [Propoxyphene]     hives  . Shellfish Allergy     "causes problems with my fibromyalgia"     Current Outpatient Medications:  .  albuterol (PROVENTIL HFA;VENTOLIN HFA) 108 (90 BASE) MCG/ACT inhaler, Inhale 2 puffs into the lungs every 6 (six) hours as needed for wheezing or shortness of breath., Disp: 18 g, Rfl: 3 .  aspirin 325 MG tablet, Take 325 mg by mouth daily., Disp: , Rfl:  .  atorvastatin (LIPITOR) 10 MG tablet, Take 1 tablet (10 mg total) by mouth daily., Disp: 90 tablet, Rfl: 3 .  buPROPion (WELLBUTRIN SR) 100 MG 12 hr tablet, Take 100 mg by mouth 2 (two) times daily. , Disp: , Rfl:  .  citalopram (CELEXA) 10 MG tablet, Take 3 tablets (30 mg total) by mouth daily. 1 Tablet, Oral, daily, Disp: 90 tablet, Rfl: 11 .  clonazePAM (KLONOPIN) 0.5 MG tablet, Take 1 tablet (0.5 mg total) by mouth 2 (two) times daily., Disp: 60 tablet, Rfl: 0 .  clopidogrel (PLAVIX) 75 MG tablet, 1 Tablet, Oral, daily, by mouth, Disp: 90 tablet, Rfl: 1 .  donepezil (ARICEPT) 5 MG tablet, Take 1 tablet (5 mg total) by mouth at bedtime., Disp: 90 tablet, Rfl: 3 .  ferrous sulfate 325 (65 FE) MG tablet, Take 1 tablet (325 mg total) by mouth 2 (two) times daily with a meal.  (Patient taking differently: Take 325 mg by mouth 3 (three) times daily with meals. ), Disp: 60 tablet, Rfl: 5 .  fluticasone (FLONASE) 50 MCG/ACT nasal spray, Place into the nose as needed. , Disp: , Rfl:  .  glucose blood test strip, Use as instructed, Disp: 100 each, Rfl: 12 .  isosorbide dinitrate (ISORDIL) 30 MG tablet, Take 30 mg by mouth daily., Disp: , Rfl:  .  isosorbide mononitrate (IMDUR) 30 MG 24 hr tablet, Take 1 tablet (30 mg total) by mouth daily., Disp: 90 tablet, Rfl: 0 .  Lancets (ONETOUCH ULTRASOFT) lancets, Use as instructed, Disp: 100 each, Rfl: 12 .  magnesium citrate SOLN, Take 148 mLs (0.5 Bottles total) by mouth once. And repeat in 6 hours if needed., Disp: 195 mL, Rfl: 1 .  metoprolol tartrate (LOPRESSOR) 25 MG tablet, Take 0.5 tablets (12.5 mg total) by mouth 2 (two) times daily., Disp: 90 tablet, Rfl: 0 .  nitroGLYCERIN (NITROSTAT) 0.4 MG SL tablet, Place under the tongue., Disp: , Rfl:  .  Omega-3 Fatty Acids (FISH OIL) 1000 MG CAPS, Take by mouth., Disp: , Rfl:  .  pantoprazole (PROTONIX) 40 MG tablet, Take 1 tablet (40 mg total) by mouth daily., Disp: 90 tablet, Rfl: 1 .  traMADol (ULTRAM) 50 MG tablet, TAKE (1) TABLET EVERY SIX HOURS AS NEEDED., Disp: 120 tablet, Rfl: 2 .  calcium-vitamin D (OSCAL) 250-125 MG-UNIT per tablet, Take 1 tablet by mouth daily., Disp: , Rfl:   Review of Systems  Constitutional: Negative for appetite change, chills, fatigue and fever.  HENT: Positive for dental problem.   Eyes: Negative.   Respiratory: Negative for chest tightness and shortness of breath.   Cardiovascular: Positive for chest pain. Negative for palpitations.  Gastrointestinal: Negative for abdominal pain, nausea and vomiting.  Endocrine: Negative.   Musculoskeletal: Positive for arthralgias.  Allergic/Immunologic: Negative.   Neurological: Negative for dizziness and weakness.  Psychiatric/Behavioral: Positive for agitation.    Social History   Tobacco Use  .  Smoking status: Current Every Day Smoker    Packs/day: 3.00    Years: 55.00    Pack years: 165.00    Types: Cigarettes  . Smokeless tobacco: Never Used  Substance Use Topics  . Alcohol use: No   Objective:   BP 120/70 (BP Location: Right Arm, Patient Position: Sitting, Cuff Size: Normal)   Pulse 70   Temp 99.3 F (37.4 C) (Oral)   Resp 16   Ht 5' (1.524 m)   Wt 146 lb (66.2 kg)   SpO2 95%   BMI 28.51 kg/m  Vitals:   01/07/18 1409  BP: 120/70  Pulse: 70  Resp: 16  Temp: 99.3 F (37.4 C)  TempSrc: Oral  SpO2: 95%  Weight: 146 lb (66.2 kg)  Height: 5' (1.524 m)     Physical Exam  Constitutional: She is oriented to person, place, and time. She appears well-developed and well-nourished.  HENT:  Head: Normocephalic and atraumatic.  Right Ear: External ear normal.  Left Ear: External ear normal.  Nose: Nose normal.  Poor dentition.  Eyes: Conjunctivae are normal. No scleral icterus.  Neck: No thyromegaly present.  Cardiovascular: Normal rate, regular rhythm and normal heart sounds.  Pulmonary/Chest: Effort normal and breath sounds normal.  Abdominal: Soft.  Neurological: She is alert and oriented to person, place, and time.  Skin: Skin is warm and dry.  Psychiatric: She has a normal mood and affect. Her behavior is normal. Thought content normal.        Assessment & Plan:     1. Hyperglycemia  - CK (Creatine Kinase) - CBC - Iron - TSH - Comprehensive Metabolic Panel (CMET)  2. Other iron deficiency anemia GI w/u never done as pt never followed through on cardiology w/u to get cleared for colonoscopy/EGD. - CK (Creatine Kinase) - CBC - Iron - TSH - Comprehensive Metabolic Panel (CMET)  3. 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 Dentist has been used in this note in any air is in the dictation or transcription are unintentional.  Megan Mans, MD  [facility]

## 2018-01-08 LAB — CBC
HEMATOCRIT: 37.4 % (ref 34.0–46.6)
HEMOGLOBIN: 12.5 g/dL (ref 11.1–15.9)
MCH: 29.1 pg (ref 26.6–33.0)
MCHC: 33.4 g/dL (ref 31.5–35.7)
MCV: 87 fL (ref 79–97)
PLATELETS: 392 10*3/uL — AB (ref 150–379)
RBC: 4.3 x10E6/uL (ref 3.77–5.28)
RDW: 15.1 % (ref 12.3–15.4)
WBC: 11.7 10*3/uL — AB (ref 3.4–10.8)

## 2018-01-08 LAB — COMPREHENSIVE METABOLIC PANEL
ALBUMIN: 3.8 g/dL (ref 3.5–4.8)
ALK PHOS: 47 IU/L (ref 39–117)
ALT: 11 IU/L (ref 0–32)
AST: 19 IU/L (ref 0–40)
Albumin/Globulin Ratio: 1.3 (ref 1.2–2.2)
BUN / CREAT RATIO: 8 — AB (ref 12–28)
BUN: 7 mg/dL — AB (ref 8–27)
Bilirubin Total: 0.2 mg/dL (ref 0.0–1.2)
CALCIUM: 9.3 mg/dL (ref 8.7–10.3)
CO2: 25 mmol/L (ref 20–29)
CREATININE: 0.83 mg/dL (ref 0.57–1.00)
Chloride: 101 mmol/L (ref 96–106)
GFR calc Af Amer: 79 mL/min/{1.73_m2} (ref >59)
GFR, EST NON AFRICAN AMERICAN: 68 mL/min/{1.73_m2} (ref >59)
GLUCOSE: 81 mg/dL (ref 65–99)
Globulin, Total: 3 g/dL (ref 1.5–4.5)
Potassium: 4.1 mmol/L (ref 3.5–5.2)
Sodium: 139 mmol/L (ref 134–144)
Total Protein: 6.8 g/dL (ref 6.0–8.5)

## 2018-01-08 LAB — CK: Total CK: 287 U/L — ABNORMAL HIGH (ref 24–173)

## 2018-01-08 LAB — TSH: TSH: 4.55 u[IU]/mL — ABNORMAL HIGH (ref 0.450–4.500)

## 2018-01-08 LAB — IRON: Iron: 62 ug/dL (ref 27–139)

## 2018-01-14 DIAGNOSIS — E7800 Pure hypercholesterolemia, unspecified: Secondary | ICD-10-CM | POA: Diagnosis not present

## 2018-01-14 DIAGNOSIS — R079 Chest pain, unspecified: Secondary | ICD-10-CM | POA: Diagnosis not present

## 2018-01-14 DIAGNOSIS — I25110 Atherosclerotic heart disease of native coronary artery with unstable angina pectoris: Secondary | ICD-10-CM | POA: Diagnosis not present

## 2018-01-14 DIAGNOSIS — Z720 Tobacco use: Secondary | ICD-10-CM | POA: Diagnosis not present

## 2018-01-14 DIAGNOSIS — I2111 ST elevation (STEMI) myocardial infarction involving right coronary artery: Secondary | ICD-10-CM | POA: Diagnosis not present

## 2018-01-15 ENCOUNTER — Other Ambulatory Visit: Payer: Self-pay | Admitting: Family Medicine

## 2018-01-15 DIAGNOSIS — G8929 Other chronic pain: Secondary | ICD-10-CM

## 2018-01-15 NOTE — Telephone Encounter (Signed)
[facility] faxed a refill request for the following medication. Thanks CC  traMADol (ULTRAM) 50 MG tablet

## 2018-01-15 NOTE — Telephone Encounter (Signed)
Please advise. Thanks.  

## 2018-01-16 ENCOUNTER — Other Ambulatory Visit: Payer: Self-pay | Admitting: Family Medicine

## 2018-01-16 DIAGNOSIS — G8929 Other chronic pain: Secondary | ICD-10-CM

## 2018-01-19 MED ORDER — TRAMADOL HCL 50 MG PO TABS: ORAL_TABLET | ORAL | 2 refills | Status: DC

## 2018-01-22 DIAGNOSIS — I25110 Atherosclerotic heart disease of native coronary artery with unstable angina pectoris: Secondary | ICD-10-CM | POA: Diagnosis not present

## 2018-01-27 ENCOUNTER — Other Ambulatory Visit: Payer: Self-pay | Admitting: Family Medicine

## 2018-01-27 DIAGNOSIS — F419 Anxiety disorder, unspecified: Secondary | ICD-10-CM

## 2018-02-05 DIAGNOSIS — Z720 Tobacco use: Secondary | ICD-10-CM | POA: Diagnosis not present

## 2018-02-05 DIAGNOSIS — I2111 ST elevation (STEMI) myocardial infarction involving right coronary artery: Secondary | ICD-10-CM | POA: Diagnosis not present

## 2018-02-05 DIAGNOSIS — Z01810 Encounter for preprocedural cardiovascular examination: Secondary | ICD-10-CM | POA: Diagnosis not present

## 2018-02-05 DIAGNOSIS — R0602 Shortness of breath: Secondary | ICD-10-CM | POA: Diagnosis not present

## 2018-02-05 DIAGNOSIS — I25110 Atherosclerotic heart disease of native coronary artery with unstable angina pectoris: Secondary | ICD-10-CM | POA: Diagnosis not present

## 2018-03-30 ENCOUNTER — Telehealth: Payer: Self-pay | Admitting: Family Medicine

## 2018-03-30 NOTE — Telephone Encounter (Signed)
Called to schedule Medicare Annual Wellness Visit with Nurse Health Advisor. If patient returns call, please note: their last AWV 01/02/17 please schedule AWV with [facility] any date   Thank you! For any questions please contact: Manuela Schwartz [phone]  Skype [email or web address]

## 2018-04-10 ENCOUNTER — Ambulatory Visit: Payer: Self-pay

## 2018-04-10 ENCOUNTER — Other Ambulatory Visit: Payer: Self-pay | Admitting: Family Medicine

## 2018-04-10 DIAGNOSIS — F0391 Unspecified dementia with behavioral disturbance: Secondary | ICD-10-CM

## 2018-04-10 DIAGNOSIS — F03918 Unspecified dementia, unspecified severity, with other behavioral disturbance: Secondary | ICD-10-CM

## 2018-04-10 NOTE — Telephone Encounter (Signed)
I left a message asking the pt to call and schedule AWV. VDM (DD) °

## 2018-04-13 ENCOUNTER — Other Ambulatory Visit: Payer: Self-pay | Admitting: Family Medicine

## 2018-04-13 DIAGNOSIS — G8929 Other chronic pain: Secondary | ICD-10-CM

## 2018-04-17 ENCOUNTER — Other Ambulatory Visit: Payer: Self-pay | Admitting: Family Medicine

## 2018-04-17 DIAGNOSIS — R739 Hyperglycemia, unspecified: Secondary | ICD-10-CM

## 2018-04-30 ENCOUNTER — Telehealth: Payer: Self-pay

## 2018-04-30 NOTE — Telephone Encounter (Signed)
Scheduled AWV for 05/07/18 @ 2:40 PM. -MM

## 2018-04-30 NOTE — Telephone Encounter (Signed)
Scheduled AWV for 05/07/18 @ 2:40 PM. -MM 

## 2018-05-04 ENCOUNTER — Other Ambulatory Visit: Payer: Self-pay | Admitting: Family Medicine

## 2018-05-07 ENCOUNTER — Ambulatory Visit (INDEPENDENT_AMBULATORY_CARE_PROVIDER_SITE_OTHER): Payer: Medicare Other

## 2018-05-07 VITALS — BP 124/58 | HR 65 | Temp 99.4°F | Ht 60.0 in | Wt 143.2 lb

## 2018-05-07 DIAGNOSIS — Z13820 Encounter for screening for osteoporosis: Secondary | ICD-10-CM | POA: Diagnosis not present

## 2018-05-07 DIAGNOSIS — Z0000 Encounter for general adult medical examination without abnormal findings: Secondary | ICD-10-CM

## 2018-05-07 NOTE — Patient Instructions (Signed)
Brittany Acevedo , Thank you for taking time to come for your Medicare Wellness Visit. I appreciate your ongoing commitment to your health goals. Please review the following plan we discussed and let me know if I can assist you in the future.   Screening recommendations/referrals: Colonoscopy: N/A Mammogram: N/A Bone Density: Referral sent today.  Recommended yearly ophthalmology/optometry visit for glaucoma screening and checkup Recommended yearly dental visit for hygiene and checkup  Vaccinations: Influenza vaccine: N/A Pneumococcal vaccine: Up to date Tdap vaccine: Pt declines today.  Shingles vaccine: Pt declines today.     Advanced directives: Advance directive discussed with you today. Even though you declined this today please call our office should you change your mind and we can give you the proper paperwork for you to fill out.  Conditions/risks identified: Recommend increasing water intake to 4 glasses a day.   Next appointment: 09/03/18 @ 2 PM with Dr Sullivan Lone.    Preventive Care 22 Years and Older, Female Preventive care refers to lifestyle choices and visits with your health care provider that can promote health and wellness. What does preventive care include?  A yearly physical exam. This is also called an annual well check.  Dental exams once or twice a year.  Routine eye exams. Ask your health care provider how often you should have your eyes checked.  Personal lifestyle choices, including:  Daily care of your teeth and gums.  Regular physical activity.  Eating a healthy diet.  Avoiding tobacco and drug use.  Limiting alcohol use.  Practicing safe sex.  Taking low-dose aspirin every day.  Taking vitamin and mineral supplements as recommended by your health care provider. What happens during an annual well check? The services and screenings done by your health care provider during your annual well check will depend on your age, overall health, lifestyle  risk factors, and family history of disease. Counseling  Your health care provider may ask you questions about your:  Alcohol use.  Tobacco use.  Drug use.  Emotional well-being.  Home and relationship well-being.  Sexual activity.  Eating habits.  History of falls.  Memory and ability to understand (cognition).  Work and work Astronomer.  Reproductive health. Screening  You may have the following tests or measurements:  Height, weight, and BMI.  Blood pressure.  Lipid and cholesterol levels. These may be checked every 5 years, or more frequently if you are over 59 years old.  Skin check.  Lung cancer screening. You may have this screening every year starting at age 45 if you have a 30-pack-year history of smoking and currently smoke or have quit within the past 15 years.  Fecal occult blood test (FOBT) of the stool. You may have this test every year starting at age 10.  Flexible sigmoidoscopy or colonoscopy. You may have a sigmoidoscopy every 5 years or a colonoscopy every 10 years starting at age 85.  Hepatitis C blood test.  Hepatitis B blood test.  Sexually transmitted disease (STD) testing.  Diabetes screening. This is done by checking your blood sugar (glucose) after you have not eaten for a while (fasting). You may have this done every 1-3 years.  Bone density scan. This is done to screen for osteoporosis. You may have this done starting at age 66.  Mammogram. This may be done every 1-2 years. Talk to your health care provider about how often you should have regular mammograms. Talk with your health care provider about your test results, treatment options, and if necessary,  the need for more tests. Vaccines  Your health care provider may recommend certain vaccines, such as:  Influenza vaccine. This is recommended every year.  Tetanus, diphtheria, and acellular pertussis (Tdap, Td) vaccine. You may need a Td booster every 10 years.  Zoster vaccine.  You may need this after age 84.  Pneumococcal 13-valent conjugate (PCV13) vaccine. One dose is recommended after age 69.  Pneumococcal polysaccharide (PPSV23) vaccine. One dose is recommended after age 97. Talk to your health care provider about which screenings and vaccines you need and how often you need them. This information is not intended to replace advice given to you by your health care provider. Make sure you discuss any questions you have with your health care provider. Document Released: 10/27/2015 Document Revised: 06/19/2016 Document Reviewed: 08/01/2015 Elsevier Interactive Patient Education  2017 [place] Prevention in the Home Falls can cause injuries. They can happen to people of all ages. There are many things you can do to make your home safe and to help prevent falls. What can I do on the outside of my home?  Regularly fix the edges of walkways and driveways and fix any cracks.  Remove anything that might make you trip as you walk through a door, such as a raised step or threshold.  Trim any bushes or trees on the path to your home.  Use bright outdoor lighting.  Clear any walking paths of anything that might make someone trip, such as rocks or tools.  Regularly check to see if handrails are loose or broken. Make sure that both sides of any steps have handrails.  Any raised decks and porches should have guardrails on the edges.  Have any leaves, snow, or ice cleared regularly.  Use sand or salt on walking paths during winter.  Clean up any spills in your garage right away. This includes oil or grease spills. What can I do in the bathroom?  Use night lights.  Install grab bars by the toilet and in the tub and shower. Do not use towel bars as grab bars.  Use non-skid mats or decals in the tub or shower.  If you need to sit down in the shower, use a plastic, non-slip stool.  Keep the floor dry. Clean up any water that spills on the floor as soon  as it happens.  Remove soap buildup in the tub or shower regularly.  Attach bath mats securely with double-sided non-slip rug tape.  Do not have throw rugs and other things on the floor that can make you trip. What can I do in the bedroom?  Use night lights.  Make sure that you have a light by your bed that is easy to reach.  Do not use any sheets or blankets that are too big for your bed. They should not hang down onto the floor.  Have a firm chair that has side arms. You can use this for support while you get dressed.  Do not have throw rugs and other things on the floor that can make you trip. What can I do in the kitchen?  Clean up any spills right away.  Avoid walking on wet floors.  Keep items that you use a lot in easy-to-reach places.  If you need to reach something above you, use a strong step stool that has a grab bar.  Keep electrical cords out of the way.  Do not use floor polish or wax that makes floors slippery. If you must use  wax, use non-skid floor wax.  Do not have throw rugs and other things on the floor that can make you trip. What can I do with my stairs?  Do not leave any items on the stairs.  Make sure that there are handrails on both sides of the stairs and use them. Fix handrails that are broken or loose. Make sure that handrails are as long as the stairways.  Check any carpeting to make sure that it is firmly attached to the stairs. Fix any carpet that is loose or worn.  Avoid having throw rugs at the top or bottom of the stairs. If you do have throw rugs, attach them to the floor with carpet tape.  Make sure that you have a light switch at the top of the stairs and the bottom of the stairs. If you do not have them, ask someone to add them for you. What else can I do to help prevent falls?  Wear shoes that:  Do not have high heels.  Have rubber bottoms.  Are comfortable and fit you well.  Are closed at the toe. Do not wear sandals.  If  you use a stepladder:  Make sure that it is fully opened. Do not climb a closed stepladder.  Make sure that both sides of the stepladder are locked into place.  Ask someone to hold it for you, if possible.  Clearly mark and make sure that you can see:  Any grab bars or handrails.  First and last steps.  Where the edge of each step is.  Use tools that help you move around (mobility aids) if they are needed. These include:  Canes.  Walkers.  Scooters.  Crutches.  Turn on the lights when you go into a dark area. Replace any light bulbs as soon as they burn out.  Set up your furniture so you have a clear path. Avoid moving your furniture around.  If any of your floors are uneven, fix them.  If there are any pets around you, be aware of where they are.  Review your medicines with your doctor. Some medicines can make you feel dizzy. This can increase your chance of falling. Ask your doctor what other things that you can do to help prevent falls. This information is not intended to replace advice given to you by your health care provider. Make sure you discuss any questions you have with your health care provider. Document Released: 07/27/2009 Document Revised: 03/07/2016 Document Reviewed: 11/04/2014 Elsevier Interactive Patient Education  2017 [employer].

## 2018-05-07 NOTE — Progress Notes (Signed)
Subjective:   Brittany Acevedo is a 77 y.o. female who presents for Medicare Annual (Subsequent) preventive examination.  Review of Systems:  N/A  Cardiac Risk Factors include: advanced age (>19men, >22 women)     Objective:     Vitals: BP (!) 124/58 (BP Location: Right Arm)   Pulse 65   Temp 99.4 F (37.4 C) (Oral)   Ht 5' (1.524 m)   Wt 143 lb 3.2 oz (65 kg)   BMI 27.97 kg/m   Body mass index is 27.97 kg/m.  Advanced Directives 05/07/2018 01/02/2017 06/13/2016 11/05/2015 05/09/2015  Does Patient Have a Medical Advance Directive? No No No No No  Would patient like information on creating a medical advance directive? No - Patient declined No - Patient declined No - patient declined information No - patient declined information -    Tobacco Social History   Tobacco Use  Smoking Status Current Every Day Smoker  . Packs/day: 1.50  . Years: 55.00  . Pack years: 82.50  . Types: Cigarettes  Smokeless Tobacco Never Used     Ready to quit: No Counseling given: No   Clinical Intake:  Pre-visit preparation completed: Yes  Pain Score: (Pt states she in pain all over due to FM.)     Nutritional Status: BMI 25 -29 Overweight Nutritional Risks: None Diabetes: No  How often do you need to have someone help you when you read instructions, pamphlets, or other written materials from your doctor or pharmacy?: 1 - Never  Interpreter Needed?: No  Information entered by :: [facility], LPN  Past Medical History:  Diagnosis Date  . Anemia   . Asthma   . Broken ankle   . Emphysema lung (HCC)   . Fibromyalgia   . GERD (gastroesophageal reflux disease)   . Heart attack (HCC) 2013  . Hemorrhoids   . Hypertension   . Peripheral nervous system disorder   . Shortness of breath dyspnea   . Sinus trouble   . Wears dentures    full upper   Past Surgical History:  Procedure Laterality Date  . ABDOMINAL HYSTERECTOMY     w/ removal of ovarian cyst  . ANGIOPLASTY  2013  .  APPENDECTOMY    . cyst removed from face  2008  . PARTIAL HYSTERECTOMY  1980  . TUBAL LIGATION  1970   Family History  Problem Relation Age of Onset  . Asthma Sister   . Diabetes Sister   . Heart disease Father   . Rheum arthritis Father   . Clotting disorder Sister   . Rheum arthritis Brother   . Rheum arthritis Sister   . Emphysema Cousin   . Asthma Other   . Heart disease Maternal Uncle   . Heart disease Maternal Grandmother   . Rheum arthritis Son   . Cancer Other    Social History   Socioeconomic History  . Marital status: Married    Spouse name: Not on file  . Number of children: 5  . Years of education: Not on file  . Highest education level: 11th grade  Occupational History  . Occupation: retired  Engineer, production  . Financial resource strain: Somewhat hard  . Food insecurity:    Worry: Never true    Inability: Never true  . Transportation needs:    Medical: No    Non-medical: No  Tobacco Use  . Smoking status: Current Every Day Smoker    Packs/day: 1.50    Years: 55.00  Pack years: 82.50    Types: Cigarettes  . Smokeless tobacco: Never Used  Substance and Sexual Activity  . Alcohol use: No  . Drug use: No  . Sexual activity: Not on file  Lifestyle  . Physical activity:    Days per week: Not on file    Minutes per session: Not on file  . Stress: Very much  Relationships  . Social connections:    Talks on phone: Not on file    Gets together: Not on file    Attends religious service: Not on file    Active member of club or organization: Not on file    Attends meetings of clubs or organizations: Not on file    Relationship status: Not on file  Other Topics Concern  . Not on file  Social History Narrative  . Not on file    Outpatient Encounter Medications as of 05/07/2018  Medication Sig  . albuterol (PROVENTIL HFA;VENTOLIN HFA) 108 (90 BASE) MCG/ACT inhaler Inhale 2 puffs into the lungs every 6 (six) hours as needed for wheezing or shortness of  breath.  Marland Kitchen aspirin 325 MG tablet Take 325 mg by mouth daily.  Marland Kitchen atorvastatin (LIPITOR) 10 MG tablet Take 1 tablet (10 mg total) by mouth daily.  Marland Kitchen buPROPion (WELLBUTRIN SR) 100 MG 12 hr tablet Take 100 mg by mouth 2 (two) times daily.   . citalopram (CELEXA) 10 MG tablet Take 3 tablets (30 mg total) by mouth daily. 1 Tablet, Oral, daily (Patient taking differently: Take 30 mg by mouth daily. )  . clonazePAM (KLONOPIN) 0.5 MG tablet Take 1 tablet (0.5 mg total) by mouth 2 (two) times daily.  . clopidogrel (PLAVIX) 75 MG tablet 1 Tablet, Oral, daily, by mouth  . donepezil (ARICEPT) 5 MG tablet Take 1 tablet (5 mg total) by mouth at bedtime.  . ferrous sulfate 325 (65 FE) MG tablet Take 1 tablet (325 mg total) by mouth 2 (two) times daily with a meal. (Patient taking differently: Take 325 mg by mouth daily with breakfast. )  . fluticasone (FLONASE) 50 MCG/ACT nasal spray Place into the nose as needed.   Marland Kitchen glucose blood test strip Use as instructed  . isosorbide dinitrate (ISORDIL) 30 MG tablet Take 30 mg by mouth daily.  . isosorbide mononitrate (IMDUR) 30 MG 24 hr tablet Take 1 tablet (30 mg total) by mouth daily.  . magnesium citrate SOLN Take 148 mLs (0.5 Bottles total) by mouth once. And repeat in 6 hours if needed.  . metoprolol tartrate (LOPRESSOR) 25 MG tablet Take 0.5 tablets (12.5 mg total) by mouth 2 (two) times daily.  . nitroGLYCERIN (NITROSTAT) 0.4 MG SL tablet Place 1 tablet (0.4 mg total) under the tongue every 5 (five) minutes as needed for chest pain.  . Omega-3 Fatty Acids (FISH OIL) 1000 MG CAPS Take by mouth daily.   Letta Pate DELICA LANCETS 33G MISC Use as instructed  . pantoprazole (PROTONIX) 40 MG tablet Take 1 tablet (40 mg total) by mouth daily.  . traMADol (ULTRAM) 50 MG tablet TAKE (1) TABLET EVERY SIX HOURS AS NEEDED.  . calcium-vitamin D (OSCAL) 250-125 MG-UNIT per tablet Take 1 tablet by mouth daily.   No facility-administered encounter medications on file as of  05/07/2018.     Activities of Daily Living In your present state of health, do you have any difficulty performing the following activities: 05/07/2018 01/07/2018  Hearing? Y Y  Comment Does not wear hearing aids.  -  Vision? Jeannie Fend  Y  Comment Needs a cornea transplant. -  Difficulty concentrating or making decisions? Malvin Johns  Walking or climbing stairs? Y Y  Comment Due to back and hip pain and SOB. -  Dressing or bathing? Y Y  Comment Husband helps with washing back.  -  Doing errands, shopping? N Y  Quarry manager and eating ? Y -  Comment Husband cooks. -  Using the Toilet? N -  In the past six months, have you accidently leaked urine? Y -  Comment Occasionally- does not wear protection yet. -  Do you have problems with loss of bowel control? N -  Managing your Medications? N -  Managing your Finances? N -  Housekeeping or managing your Housekeeping? Y -  Comment Has assistance cleaning monthly.  -  Some recent data might be hidden    Patient Care Team: Maple Hudson., MD as PCP - General (Family Medicine) Irene Limbo., MD as Consulting Physician (Ophthalmology)    Assessment:   This is a routine wellness examination for [place].  Exercise Activities and Dietary recommendations Current Exercise Habits: The patient does not participate in regular exercise at present, Exercise limited by: orthopedic condition(s);respiratory conditions(s)  Goals    . DIET - INCREASE WATER INTAKE     Recommend increasing water intake to 4 glasses a day.        Fall Risk Fall Risk  05/07/2018 01/07/2018 01/02/2017  Falls in the past year? No No Yes  Number falls in past yr: - - 1  Injury with Fall? - - Yes  Comment - - broken ankle   Is the patient's home free of loose throw rugs in walkways, pet beds, electrical cords, etc?   yes      Grab bars in the bathroom? yes      Handrails on the stairs?   no      Adequate lighting?   yes  Timed Get Up and Go performed: N/A  Depression  Screen PHQ 2/9 Scores 05/07/2018 01/07/2018 01/02/2017  PHQ - 2 Score 4 4 2   PHQ- 9 Score 17 17 16      Cognitive Function: Pt declined screening today.      6CIT Screen 01/02/2017  What Year? 0 points  What month? 0 points  What time? 0 points  Count back from 20 0 points  Months in reverse 0 points  Repeat phrase 4 points  Total Score 4    Immunization History  Administered Date(s) Administered  . Pneumococcal Conjugate-13 02/03/2017  . Pneumococcal Polysaccharide-23 02/02/2008, 02/01/2010    Qualifies for Shingles Vaccine? Due for Shingles vaccine. Declined my offer to administer today. Education has been provided regarding the importance of this vaccine. Pt has been advised to call her insurance company to determine her out of pocket expense. Advised she may also receive this vaccine at her local pharmacy or Health Dept. Verbalized acceptance and understanding.  Screening Tests Health Maintenance  Topic Date Due  . DEXA SCAN  10/14/2026 (Originally 07/02/2005)  . TETANUS/TDAP  10/14/2026 (Originally 07/03/1960)  . INFLUENZA VACCINE  05/14/2018  . PNA vac Low Risk Adult  Completed    Cancer Screenings: Lung: Low Dose CT Chest recommended if Age 31-80 years, 30 pack-year currently smoking OR have quit w/in 15years. Patient does qualify, however pt is up to date on this screening. Breast:  Up to date on Mammogram? Yes   Up to date of Bone Density/Dexa? No, referral sent today.  Colorectal: N/A  Additional  Screenings:  Hepatitis C Screening: N/A     Plan:  I have personally reviewed and addressed the Medicare Annual Wellness questionnaire and have noted the following in the patient's chart:  A. Medical and social history B. Use of alcohol, tobacco or illicit drugs  C. Current medications and supplements D. Functional ability and status E.  Nutritional status F.  Physical activity G. Advance directives H. List of other physicians I.  Hospitalizations, surgeries, and ER  visits in previous 12 months J.  Vitals K. Screenings such as hearing and vision if needed, cognitive and depression L. Referrals and appointments - none  In addition, I have reviewed and discussed with patient certain preventive protocols, quality metrics, and best practice recommendations. A written personalized care plan for preventive services as well as general preventive health recommendations were provided to patient.  See attached scanned questionnaire for additional information.   Signed,  Hyacinth Meeker, LPN Nurse Health Advisor   Nurse Recommendations: Pt declined the tetanus vaccine today. DEXA scan referral sent today.

## 2018-05-27 ENCOUNTER — Other Ambulatory Visit: Payer: Self-pay | Admitting: Family Medicine

## 2018-05-27 DIAGNOSIS — G8929 Other chronic pain: Secondary | ICD-10-CM

## 2018-06-19 ENCOUNTER — Other Ambulatory Visit: Payer: Self-pay | Admitting: Family Medicine

## 2018-06-25 DIAGNOSIS — R0602 Shortness of breath: Secondary | ICD-10-CM | POA: Diagnosis not present

## 2018-06-25 DIAGNOSIS — Z720 Tobacco use: Secondary | ICD-10-CM | POA: Diagnosis not present

## 2018-06-25 DIAGNOSIS — I2111 ST elevation (STEMI) myocardial infarction involving right coronary artery: Secondary | ICD-10-CM | POA: Diagnosis not present

## 2018-06-25 DIAGNOSIS — I25110 Atherosclerotic heart disease of native coronary artery with unstable angina pectoris: Secondary | ICD-10-CM | POA: Diagnosis not present

## 2018-06-25 DIAGNOSIS — Z9889 Other specified postprocedural states: Secondary | ICD-10-CM | POA: Diagnosis not present

## 2018-07-06 ENCOUNTER — Other Ambulatory Visit: Payer: Self-pay | Admitting: Family Medicine

## 2018-07-08 ENCOUNTER — Other Ambulatory Visit: Payer: Self-pay

## 2018-07-13 ENCOUNTER — Other Ambulatory Visit: Payer: Self-pay

## 2018-07-20 ENCOUNTER — Other Ambulatory Visit: Payer: Self-pay | Admitting: Family Medicine

## 2018-07-20 DIAGNOSIS — F419 Anxiety disorder, unspecified: Secondary | ICD-10-CM

## 2018-09-03 ENCOUNTER — Encounter: Payer: Self-pay | Admitting: Family Medicine

## 2018-09-03 ENCOUNTER — Ambulatory Visit (INDEPENDENT_AMBULATORY_CARE_PROVIDER_SITE_OTHER): Payer: Medicare Other | Admitting: Family Medicine

## 2018-09-03 VITALS — BP 116/70 | HR 88 | Temp 99.5°F | Resp 16 | Ht 60.0 in | Wt 135.0 lb

## 2018-09-03 DIAGNOSIS — Z720 Tobacco use: Secondary | ICD-10-CM

## 2018-09-03 DIAGNOSIS — J440 Chronic obstructive pulmonary disease with acute lower respiratory infection: Secondary | ICD-10-CM

## 2018-09-03 DIAGNOSIS — F329 Major depressive disorder, single episode, unspecified: Secondary | ICD-10-CM

## 2018-09-03 DIAGNOSIS — H9113 Presbycusis, bilateral: Secondary | ICD-10-CM

## 2018-09-03 DIAGNOSIS — J209 Acute bronchitis, unspecified: Secondary | ICD-10-CM

## 2018-09-03 DIAGNOSIS — J45909 Unspecified asthma, uncomplicated: Secondary | ICD-10-CM | POA: Diagnosis not present

## 2018-09-03 DIAGNOSIS — E7800 Pure hypercholesterolemia, unspecified: Secondary | ICD-10-CM

## 2018-09-03 DIAGNOSIS — Z0000 Encounter for general adult medical examination without abnormal findings: Secondary | ICD-10-CM | POA: Diagnosis not present

## 2018-09-03 DIAGNOSIS — F03918 Unspecified dementia, unspecified severity, with other behavioral disturbance: Secondary | ICD-10-CM

## 2018-09-03 DIAGNOSIS — F32A Depression, unspecified: Secondary | ICD-10-CM

## 2018-09-03 DIAGNOSIS — D509 Iron deficiency anemia, unspecified: Secondary | ICD-10-CM

## 2018-09-03 DIAGNOSIS — F0391 Unspecified dementia with behavioral disturbance: Secondary | ICD-10-CM

## 2018-09-03 MED ORDER — ALBUTEROL SULFATE HFA 108 (90 BASE) MCG/ACT IN AERS: 2.0000 | INHALATION_SPRAY | Freq: Four times a day (QID) | RESPIRATORY_TRACT | 3 refills | Status: DC | PRN

## 2018-09-03 MED ORDER — DOXYCYCLINE HYCLATE 100 MG PO TABS: 100.0000 mg | ORAL_TABLET | Freq: Two times a day (BID) | ORAL | 0 refills | Status: DC

## 2018-09-03 MED ORDER — DONEPEZIL HCL 10 MG PO TABS: 10.0000 mg | ORAL_TABLET | Freq: Every day | ORAL | 5 refills | Status: DC

## 2018-09-03 NOTE — Patient Instructions (Signed)
Call if cough not improving.

## 2018-09-03 NOTE — Progress Notes (Signed)
Patient: Brittany Acevedo, Female    DOB: [birth year], 77 y.o.   MRN: [id number] Visit Date: 09/03/2018  Today's Provider: Megan Mans, MD   Chief Complaint  Patient presents with  . Annual Wellness Exam   Subjective:  Patient saw Ronne Binning for AWE on 05/07/2018.   Annual Physical Brittany Acevedo is a 77 y.o. female. She feels fairly well. She reports exercising not regularly. She reports she is sleeping fairly well. Her husband just died last week. She has multiple complaints today.  She has had a productive cough for 2 weeks with some shortness of breath.  She is unsteady on her feet.  Her memory is getting worse per her son who is with her today.  She also has her hearing loss which is chronic and worsening.  Also complains of soreness in the rough of her mouth in the area where her dentures touch. Colonoscopy- 2017? Mammogram- 11/29/2011. Normal. Repeat 1 year. [facility] [place].   Patient reports that she has had dizzy spells and feeling off balance for several months. She reports that symptoms come and go.   Review of Systems  Constitutional: Positive for activity change and fatigue.  HENT: Positive for hearing loss.   Eyes: Negative.   Respiratory: Positive for cough. Negative for shortness of breath.        Cough for past 2 weeks.  Cardiovascular: Negative for chest pain, palpitations and leg swelling.  Gastrointestinal: Negative.   Endocrine: Negative.   Genitourinary: Negative.   Musculoskeletal: Positive for arthralgias and myalgias.  Allergic/Immunologic: Negative.   Neurological:       Mild progressive memory issues. She also has progressive unsteady gait.  Psychiatric/Behavioral: Negative.     Social History   Socioeconomic History  . Marital status: Widowed    Spouse name: Not on file  . Number of children: 5  . Years of education: Not on file  . Highest education level: 11th grade  Occupational History  . Occupation: retired  Engineer, production  .  Financial resource strain: Somewhat hard  . Food insecurity:    Worry: Never true    Inability: Never true  . Transportation needs:    Medical: No    Non-medical: No  Tobacco Use  . Smoking status: Current Every Day Smoker    Packs/day: 1.50    Years: 55.00    Pack years: 82.50    Types: Cigarettes  . Smokeless tobacco: Never Used  Substance and Sexual Activity  . Alcohol use: No  . Drug use: No  . Sexual activity: Not on file  Lifestyle  . Physical activity:    Days per week: Not on file    Minutes per session: Not on file  . Stress: Very much  Relationships  . Social connections:    Talks on phone: Not on file    Gets together: Not on file    Attends religious service: Not on file    Active member of club or organization: Not on file    Attends meetings of clubs or organizations: Not on file    Relationship status: Not on file  . Intimate partner violence:    Fear of current or ex partner: Not on file    Emotionally abused: Not on file    Physically abused: Not on file    Forced sexual activity: Not on file  Other Topics Concern  . Not on file  Social History Narrative  . Not on file  Past Medical History:  Diagnosis Date  . Anemia   . Asthma   . Broken ankle   . Emphysema lung (HCC)   . Fibromyalgia   . GERD (gastroesophageal reflux disease)   . Heart attack (HCC) 2013  . Hemorrhoids   . Hypertension   . Peripheral nervous system disorder   . Shortness of breath dyspnea   . Sinus trouble   . Wears dentures    full upper     Patient Active Problem List   Diagnosis Date Noted  . Personal history of tobacco use, presenting hazards to health 10/15/2016  . Chronic pain 09/26/2016  . Hyperglycemia 05/09/2015  . Pelvic pain in female 05/09/2015  . Constipation 05/09/2015  . Anxiety 04/22/2015  . Autoimmune disease (HCC) 04/22/2015  . Body mass index (BMI) of 28.0-28.9 in adult 04/22/2015  . Atherosclerosis of coronary artery 04/22/2015  . Chronic  headache 04/22/2015  . Dementia (HCC) 04/22/2015  . Clinical depression 04/22/2015  . Dizziness 04/22/2015  . Family history of diabetes mellitus 04/22/2015  . Broken rib 04/22/2015  . Acid reflux 04/22/2015  . Cannot sleep 04/22/2015  . Amnesia 04/22/2015  . Cramp in muscle 04/22/2015  . Excessive urination at night 04/22/2015  . Decreased potassium in the blood 04/22/2015  . Excessive falling 04/22/2015  . Pain in rib 04/22/2015  . History of biliary T-tube placement 04/22/2015  . Iron deficiency anemia 06/06/2014  . Anemia 05/02/2014  . Dyspnea 04/11/2014  . Tobacco abuse 04/11/2014  . 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 Fibrositis 03/16/2014  . Heart attack (HCC) 03/16/2014  . Hypercholesteremia 04/15/2006    Past Surgical History:  Procedure Laterality Date  . ABDOMINAL HYSTERECTOMY     w/ removal of ovarian cyst  . ANGIOPLASTY  2013  . APPENDECTOMY    . cyst removed from face  2008  . PARTIAL HYSTERECTOMY  1980  . TUBAL LIGATION  1970    Her family history includes Asthma in her other and sister; Cancer in her other; Clotting disorder in her sister; Diabetes in her sister; Emphysema in her cousin; Heart disease in her father, maternal grandmother, and maternal uncle; Rheum arthritis in her brother, father, sister, and son.      Current Outpatient Medications:  .  albuterol (PROVENTIL HFA;VENTOLIN HFA) 108 (90 BASE) MCG/ACT inhaler, Inhale 2 puffs into the lungs every 6 (six) hours as needed for wheezing or shortness of breath., Disp: 18 g, Rfl: 3 .  aspirin 325 MG tablet, Take 325 mg by mouth daily., Disp: , Rfl:  .  atorvastatin (LIPITOR) 10 MG tablet, Take 1 tablet (10 mg total) by mouth daily., Disp: 90 tablet, Rfl: 3 .  buPROPion (WELLBUTRIN SR) 100 MG 12 hr tablet, Take 100 mg by mouth 2 (two) times daily. , Disp: , Rfl:  .  calcium-vitamin D (OSCAL) 250-125 MG-UNIT per tablet, Take 1 tablet by mouth daily., Disp: , Rfl:  .   citalopram (CELEXA) 10 MG tablet, Take 3 tablets (30 mg total) by mouth daily. 1 Tablet, Oral, daily (Patient taking differently: Take 30 mg by mouth daily. ), Disp: 90 tablet, Rfl: 11 .  clonazePAM (KLONOPIN) 0.5 MG tablet, Take 1 tablet (0.5 mg total) by mouth 2 (two) times daily., Disp: 60 tablet, Rfl: 3 .  clopidogrel (PLAVIX) 75 MG tablet, 1 Tablet, Oral, daily, by mouth, Disp: 90 tablet, Rfl: 0 .  donepezil (ARICEPT) 5 MG tablet, Take 1 tablet (5 mg total) by mouth  at bedtime., Disp: 90 tablet, Rfl: 0 .  ferrous sulfate 325 (65 FE) MG tablet, Take 1 tablet (325 mg total) by mouth 2 (two) times daily with a meal. (Patient taking differently: Take 325 mg by mouth daily with breakfast. ), Disp: 60 tablet, Rfl: 5 .  fluticasone (FLONASE) 50 MCG/ACT nasal spray, Place into the nose as needed. , Disp: , Rfl:  .  glucose blood test strip, Use as instructed, Disp: 100 each, Rfl: 12 .  isosorbide dinitrate (ISORDIL) 30 MG tablet, Take 30 mg by mouth daily., Disp: , Rfl:  .  isosorbide mononitrate (IMDUR) 30 MG 24 hr tablet, Take 1 tablet (30 mg total) by mouth daily., Disp: 90 tablet, Rfl: 0 .  magnesium citrate SOLN, Take 148 mLs (0.5 Bottles total) by mouth once. And repeat in 6 hours if needed., Disp: 195 mL, Rfl: 1 .  metoprolol tartrate (LOPRESSOR) 25 MG tablet, Take 0.5 tablets (12.5 mg total) by mouth 2 (two) times daily., Disp: 90 tablet, Rfl: 3 .  nitroGLYCERIN (NITROSTAT) 0.4 MG SL tablet, Place 1 tablet (0.4 mg total) under the tongue every 5 (five) minutes as needed for chest pain., Disp: 25 tablet, Rfl: 11 .  Omega-3 Fatty Acids (FISH OIL) 1000 MG CAPS, Take by mouth daily. , Disp: , Rfl:  .  ONETOUCH DELICA LANCETS 33G MISC, Use as instructed, Disp: 100 each, Rfl: 11 .  pantoprazole (PROTONIX) 40 MG tablet, Take 1 tablet (40 mg total) by mouth daily., Disp: 90 tablet, Rfl: 0 .  traMADol (ULTRAM) 50 MG tablet, TAKE (1) TABLET EVERY SIX HOURS AS NEEDED., Disp: 120 tablet, Rfl: 0 .  traMADol  (ULTRAM) 50 MG tablet, TAKE (1) TABLET EVERY SIX HOURS AS NEEDED., Disp: 120 tablet, Rfl: 1  Patient Care Team: Maple Hudson., MD as PCP - General (Family Medicine) Irene Limbo., MD as Consulting Physician (Ophthalmology)     Objective:   Vitals: BP 116/70 (BP Location: Left Arm, Patient Position: Sitting, Cuff Size: Normal)   Pulse 88   Temp 99.5 F (37.5 C)   Resp 16   Ht 5' (1.524 m)   Wt 135 lb (61.2 kg)   BMI 26.37 kg/m   Physical Exam  Constitutional: She is oriented to person, place, and time. She appears well-developed and well-nourished.  HENT:  Head: Normocephalic and atraumatic.  Right Ear: External ear normal.  Left Ear: External ear normal.  Nose: Nose normal.  Mouth/Throat: Oropharynx is clear and moist.  She has some mild erythema of the roof of the mouth when she takes out her dentures.  Eyes: Conjunctivae are normal.  Neck: No thyromegaly present.  Cardiovascular: Normal rate, regular rhythm and normal heart sounds.  Pulmonary/Chest: Effort normal.  Abdominal: Soft.  Musculoskeletal: She exhibits no edema.  Neurological: She is alert and oriented to person, place, and time.  Skin: Skin is warm and dry.  Psychiatric: She has a normal mood and affect. Her behavior is normal. Judgment and thought content normal.    Activities of Daily Living In your present state of health, do you have any difficulty performing the following activities: 05/07/2018 01/07/2018  Hearing? Y Y  Comment Does not wear hearing aids.  -  Vision? Y Y  Comment Needs a cornea transplant. -  Difficulty concentrating or making decisions? Malvin Johns  Walking or climbing stairs? Y Y  Comment Due to back and hip pain and SOB. -  Dressing or bathing? Y Y  Comment Husband helps with washing  back.  -  Doing errands, shopping? N Y  Quarry manager and eating ? Y -  Comment Husband cooks. -  Using the Toilet? N -  In the past six months, have you accidently leaked urine? Y -  Comment  Occasionally- does not wear protection yet. -  Do you have problems with loss of bowel control? N -  Managing your Medications? N -  Managing your Finances? N -  Housekeeping or managing your Housekeeping? Y -  Comment Has assistance cleaning monthly.  -  Some recent data might be hidden    Fall Risk Assessment Fall Risk  05/07/2018 01/07/2018 01/02/2017  Falls in the past year? No No Yes  Number falls in past yr: - - 1  Injury with Fall? - - Yes  Comment - - broken ankle     Depression Screen PHQ 2/9 Scores 05/07/2018 01/07/2018 01/02/2017  PHQ - 2 Score 4 4 2   PHQ- 9 Score 17 17 16     Cognitive Testing - 6-CIT 6CIT Screen 01/02/2017  What Year? 0 points  What month? 0 points  What time? 0 points  Count back from 20 0 points  Months in reverse 0 points  Repeat phrase 4 points  Total Score 4        Assessment & Plan:     Annual Physical  Reviewed patient's Family Medical History Reviewed and updated list of patient's medical providers Assessment of cognitive impairment was done Assessed patient's functional ability Established a written schedule for health screening services Health Risk Assessent Completed and Reviewed  Exercise Activities and Dietary recommendations Goals    . DIET - INCREASE WATER INTAKE     Recommend increasing water intake to 4 glasses a day.     . Increase water intake     Recommend increasing water intake to 3 glasses of water a day.       Immunization History  Administered Date(s) Administered  . Pneumococcal Conjugate-13 02/03/2017  . Pneumococcal Polysaccharide-23 02/02/2008, 02/01/2010    Health Maintenance  Topic Date Due  . INFLUENZA VACCINE  05/14/2018  . DEXA SCAN  10/14/2026 (Originally 07/02/2005)  . TETANUS/TDAP  10/14/2026 (Originally 07/03/1959)  . PNA vac Low Risk Adult  Completed    Pt declines gyn exam or other screening today. Discussed health benefits of physical activity, and encouraged her to engage in  regular exercise appropriate for her age and condition.  1. Encounter for annual physical examination excluding gynecological examination in a patient older than 17 years   2. Tobacco abuse Pt encouraged to stop.  3. Depression, unspecified depression type   4. Asthma, unspecified asthma severity, unspecified whether complicated, unspecified whether persistent More ikely COPD. - albuterol (PROVENTIL HFA;VENTOLIN HFA) 108 (90 Base) MCG/ACT inhaler; Inhale 2 puffs into the lungs every 6 (six) hours as needed for wheezing or shortness of breath.  Dispense: 18 g; Refill: 3  5. Chronic  bronchitis with COPD (HCC)  - doxycycline (VIBRA-TABS) 100 MG tablet; Take 1 tablet (100 mg total) by mouth 2 (two) times daily.  Dispense: 20 tablet; Refill: 0  6. Dementia with behavioral disturbance Increase dose of Aricept.RTC 3 months. - donepezil (ARICEPT) 10 MG tablet; Take 1 tablet (10 mg total) by mouth at bedtime.  Dispense: 30 tablet; Refill: 5  7. Hypercholesteremia  - Comprehensive metabolic panel - Lipid panel - TSH  8. Iron deficiency anemia, unspecified iron deficiency anemia type Patient has declined work-up for this in the past.  We have made  several GI referral since she has declined to keep appointments. - CBC with Differential/Platelet    I have done the exam and reviewed the chart and it is accurate to the best of my knowledge. Dentist has been used and  any errors in dictation or transcription are unintentional. Julieanne Manson M.D. [facility]  Megan Mans, MD  [facility]

## 2018-09-04 DIAGNOSIS — E7800 Pure hypercholesterolemia, unspecified: Secondary | ICD-10-CM | POA: Diagnosis not present

## 2018-09-04 DIAGNOSIS — D509 Iron deficiency anemia, unspecified: Secondary | ICD-10-CM | POA: Diagnosis not present

## 2018-09-05 LAB — COMPREHENSIVE METABOLIC PANEL
A/G RATIO: 1.3 (ref 1.2–2.2)
ALK PHOS: 61 IU/L (ref 39–117)
ALT: 14 IU/L (ref 0–32)
AST: 21 IU/L (ref 0–40)
Albumin: 3.5 g/dL (ref 3.5–4.8)
BUN/Creatinine Ratio: 13 (ref 12–28)
BUN: 13 mg/dL (ref 8–27)
Bilirubin Total: <0.2 mg/dL (ref 0.0–1.2)
CO2: 25 mmol/L (ref 20–29)
CREATININE: 0.97 mg/dL (ref 0.57–1.00)
Calcium: 9 mg/dL (ref 8.7–10.3)
Chloride: 101 mmol/L (ref 96–106)
GFR calc Af Amer: 65 mL/min/{1.73_m2} (ref >59)
GFR calc non Af Amer: 56 mL/min/{1.73_m2} — ABNORMAL LOW (ref >59)
Globulin, Total: 2.8 g/dL (ref 1.5–4.5)
Glucose: 101 mg/dL — ABNORMAL HIGH (ref 65–99)
POTASSIUM: 4.1 mmol/L (ref 3.5–5.2)
Sodium: 139 mmol/L (ref 134–144)
Total Protein: 6.3 g/dL (ref 6.0–8.5)

## 2018-09-05 LAB — CBC WITH DIFFERENTIAL/PLATELET
BASOS ABS: 0.1 10*3/uL (ref 0.0–0.2)
Basos: 1 %
EOS (ABSOLUTE): 0.8 10*3/uL — AB (ref 0.0–0.4)
EOS: 6 %
HEMATOCRIT: 37.6 % (ref 34.0–46.6)
Hemoglobin: 12.1 g/dL (ref 11.1–15.9)
Immature Grans (Abs): 0 10*3/uL (ref 0.0–0.1)
Immature Granulocytes: 0 %
LYMPHS ABS: 2.7 10*3/uL (ref 0.7–3.1)
Lymphs: 22 %
MCH: 28.3 pg (ref 26.6–33.0)
MCHC: 32.2 g/dL (ref 31.5–35.7)
MCV: 88 fL (ref 79–97)
MONOS ABS: 1 10*3/uL — AB (ref 0.1–0.9)
Monocytes: 8 %
Neutrophils Absolute: 7.6 10*3/uL — ABNORMAL HIGH (ref 1.4–7.0)
Neutrophils: 63 %
Platelets: 501 10*3/uL — ABNORMAL HIGH (ref 150–450)
RBC: 4.27 x10E6/uL (ref 3.77–5.28)
RDW: 13.4 % (ref 12.3–15.4)
WBC: 12.2 10*3/uL — ABNORMAL HIGH (ref 3.4–10.8)

## 2018-09-05 LAB — LIPID PANEL
CHOLESTEROL TOTAL: 205 mg/dL — AB (ref 100–199)
Chol/HDL Ratio: 6.2 ratio — ABNORMAL HIGH (ref 0.0–4.4)
HDL: 33 mg/dL — ABNORMAL LOW (ref >39)
LDL Calculated: 139 mg/dL — ABNORMAL HIGH (ref 0–99)
TRIGLYCERIDES: 163 mg/dL — AB (ref 0–149)
VLDL Cholesterol Cal: 33 mg/dL (ref 5–40)

## 2018-09-05 LAB — TSH: TSH: 5.97 u[IU]/mL — ABNORMAL HIGH (ref 0.450–4.500)

## 2018-09-08 ENCOUNTER — Telehealth: Payer: Self-pay

## 2018-09-08 NOTE — Telephone Encounter (Signed)
Advised  ED 

## 2018-09-08 NOTE — Telephone Encounter (Signed)
-----   Message from Maple Hudson., MD sent at 09/08/2018  8:33 AM EST ----- Labs presently stable.

## 2018-09-08 NOTE — Telephone Encounter (Signed)
LMTCB

## 2018-09-11 ENCOUNTER — Other Ambulatory Visit: Payer: Self-pay | Admitting: Family Medicine

## 2018-09-11 DIAGNOSIS — G8929 Other chronic pain: Secondary | ICD-10-CM

## 2018-10-01 ENCOUNTER — Telehealth: Payer: Self-pay

## 2018-10-01 NOTE — Telephone Encounter (Signed)
Patient was treated with Doxy on 09/03/18 for chronic bronchitis.  Do you want to refill.

## 2018-10-01 NOTE — Telephone Encounter (Signed)
Patients son had contacted office stating that his mother  Was on antibiotic for cough and congestion and recently completed medication, son states that patients symptoms are not improved and have gradually been getting worse. He is requesting that prescription be refilled and sent to [facility]. When I looked in patients chart it seems that last office visit was in November, when I asked son if this was time he was pertaining to he said yes. Please review chart and advise.KW

## 2018-10-02 NOTE — Telephone Encounter (Signed)
No she needs to be seen.

## 2018-10-02 NOTE — Telephone Encounter (Signed)
Patient's son called again in regards to this. Please advise. Thanks!

## 2018-10-05 NOTE — Telephone Encounter (Signed)
No answer and unable to leave message. Will try again later.

## 2018-10-22 ENCOUNTER — Ambulatory Visit: Payer: Self-pay | Admitting: Family Medicine

## 2018-10-31 ENCOUNTER — Other Ambulatory Visit: Payer: Self-pay | Admitting: Family Medicine

## 2018-11-07 ENCOUNTER — Telehealth: Payer: Self-pay

## 2018-11-07 NOTE — Telephone Encounter (Signed)
Call pt regarding lung screening. Left message for pt to return call.  

## 2018-11-09 ENCOUNTER — Ambulatory Visit: Payer: Self-pay | Admitting: Family Medicine

## 2018-11-10 ENCOUNTER — Encounter: Payer: Self-pay | Admitting: Family Medicine

## 2018-11-10 ENCOUNTER — Ambulatory Visit (INDEPENDENT_AMBULATORY_CARE_PROVIDER_SITE_OTHER): Payer: Medicare Other | Admitting: Family Medicine

## 2018-11-10 VITALS — BP 122/70 | HR 74 | Temp 98.0°F | Resp 16 | Wt 145.0 lb

## 2018-11-10 DIAGNOSIS — F329 Major depressive disorder, single episode, unspecified: Secondary | ICD-10-CM | POA: Diagnosis not present

## 2018-11-10 DIAGNOSIS — Z720 Tobacco use: Secondary | ICD-10-CM

## 2018-11-10 DIAGNOSIS — M25511 Pain in right shoulder: Secondary | ICD-10-CM

## 2018-11-10 DIAGNOSIS — F0391 Unspecified dementia with behavioral disturbance: Secondary | ICD-10-CM | POA: Diagnosis not present

## 2018-11-10 DIAGNOSIS — F32A Depression, unspecified: Secondary | ICD-10-CM

## 2018-11-10 DIAGNOSIS — N3090 Cystitis, unspecified without hematuria: Secondary | ICD-10-CM | POA: Diagnosis not present

## 2018-11-10 NOTE — Progress Notes (Signed)
Patient: Brittany Acevedo Female    DOB: [birth year]   78 y.o.   MRN: [id number] Visit Date: 11/10/2018  Today's Provider: Megan Mansichard  Jr, MD   Chief Complaint  Patient presents with  . Follow-up   Subjective:     HPI Patient comes in today for a follow up. She was last seen in the office 2 months ago. At that time, Aricept was increased to 10mg . She seems to have tolerated med changes well.  She continues to smoke.  Has mild chronic sputum production in the morning.  Coughing or short of breath during the day.  She complains of chronic pain in her back and extremities. Allergies  Allergen Reactions  . Gabapentin Anaphylaxis    Throat closes  . Darvon [Propoxyphene]     hives  . Shellfish Allergy     "causes problems with my fibromyalgia"     Current Outpatient Medications:  .  albuterol (PROVENTIL HFA;VENTOLIN HFA) 108 (90 Base) MCG/ACT inhaler, Inhale 2 puffs into the lungs every 6 (six) hours as needed for wheezing or shortness of breath., Disp: 18 g, Rfl: 3 .  aspirin 325 MG tablet, Take 325 mg by mouth daily., Disp: , Rfl:  .  atorvastatin (LIPITOR) 10 MG tablet, Take 1 tablet (10 mg total) by mouth daily., Disp: 90 tablet, Rfl: 3 .  buPROPion (WELLBUTRIN SR) 100 MG 12 hr tablet, Take 100 mg by mouth 2 (two) times daily. , Disp: , Rfl:  .  buPROPion (WELLBUTRIN SR) 150 MG 12 hr tablet, Take 1 tablet (150 mg total) by mouth 2 (two) times daily., Disp: 60 tablet, Rfl: 5 .  calcium-vitamin D (OSCAL) 250-125 MG-UNIT per tablet, Take 1 tablet by mouth daily., Disp: , Rfl:  .  citalopram (CELEXA) 10 MG tablet, Take 3 tablets (30 mg total) by mouth daily. 1 Tablet, Oral, daily (Patient taking differently: Take 30 mg by mouth daily. ), Disp: 90 tablet, Rfl: 11 .  clonazePAM (KLONOPIN) 0.5 MG tablet, Take 1 tablet (0.5 mg total) by mouth 2 (two) times daily., Disp: 60 tablet, Rfl: 3 .  clopidogrel (PLAVIX) 75 MG tablet, 1 Tablet, Oral, daily, by mouth, Disp: 90 tablet, Rfl:  0 .  donepezil (ARICEPT) 10 MG tablet, Take 1 tablet (10 mg total) by mouth at bedtime., Disp: 30 tablet, Rfl: 5 .  doxycycline (VIBRA-TABS) 100 MG tablet, Take 1 tablet (100 mg total) by mouth 2 (two) times daily., Disp: 20 tablet, Rfl: 0 .  ferrous sulfate 325 (65 FE) MG tablet, Take 1 tablet (325 mg total) by mouth 2 (two) times daily with a meal. (Patient taking differently: Take 325 mg by mouth daily with breakfast. ), Disp: 60 tablet, Rfl: 5 .  fluticasone (FLONASE) 50 MCG/ACT nasal spray, Place into the nose as needed. , Disp: , Rfl:  .  glucose blood test strip, Use as instructed, Disp: 100 each, Rfl: 12 .  isosorbide dinitrate (ISORDIL) 30 MG tablet, Take 30 mg by mouth daily., Disp: , Rfl:  .  isosorbide mononitrate (IMDUR) 30 MG 24 hr tablet, Take 1 tablet (30 mg total) by mouth daily., Disp: 90 tablet, Rfl: 3 .  magnesium citrate SOLN, Take 148 mLs (0.5 Bottles total) by mouth once. And repeat in 6 hours if needed., Disp: 195 mL, Rfl: 1 .  metoprolol tartrate (LOPRESSOR) 25 MG tablet, Take 0.5 tablets (12.5 mg total) by mouth 2 (two) times daily., Disp: 90 tablet, Rfl: 3 .  nitroGLYCERIN (NITROSTAT) 0.4  MG SL tablet, Place 1 tablet (0.4 mg total) under the tongue every 5 (five) minutes as needed for chest pain., Disp: 25 tablet, Rfl: 11 .  Omega-3 Fatty Acids (FISH OIL) 1000 MG CAPS, Take by mouth daily. , Disp: , Rfl:  .  ONETOUCH DELICA LANCETS 33G MISC, Use as instructed, Disp: 100 each, Rfl: 11 .  pantoprazole (PROTONIX) 40 MG tablet, Take 1 tablet (40 mg total) by mouth daily., Disp: 90 tablet, Rfl: 0 .  traMADol (ULTRAM) 50 MG tablet, TAKE (1) TABLET EVERY SIX HOURS AS NEEDED., Disp: 120 tablet, Rfl: 0 .  traMADol (ULTRAM) 50 MG tablet, TAKE (1) TABLET EVERY SIX HOURS AS NEEDED., Disp: 120 tablet, Rfl: 0  Review of Systems  Constitutional: Negative.   HENT: Negative.   Eyes: Negative.   Respiratory: Negative for cough and shortness of breath.   Cardiovascular: Negative for  chest pain, palpitations and leg swelling.  Gastrointestinal: Negative.   Endocrine: Negative.   Musculoskeletal: Positive for arthralgias.       Especially right shoulder.  Allergic/Immunologic: Negative.   Neurological: Negative for dizziness and headaches.  Psychiatric/Behavioral: Positive for confusion. Negative for agitation, self-injury, sleep disturbance and suicidal ideas. The patient is not nervous/anxious.     Social History   Tobacco Use  . Smoking status: Current Every Day Smoker    Packs/day: 1.50    Years: 55.00    Pack years: 82.50    Types: Cigarettes  . Smokeless tobacco: Never Used  Substance Use Topics  . Alcohol use: No      Objective:   BP 122/70   Pulse 74   Temp 98 F (36.7 C)   Resp 16   Wt 145 lb (65.8 kg)   SpO2 94%   BMI 28.32 kg/m  Vitals:   11/10/18 1522  BP: 122/70  Pulse: 74  Resp: 16  Temp: 98 F (36.7 C)  SpO2: 94%  Weight: 145 lb (65.8 kg)     Physical Exam Vitals signs reviewed.  Constitutional:      Appearance: She is well-developed.  HENT:     Head: Normocephalic and atraumatic.     Right Ear: External ear normal.     Left Ear: External ear normal.     Nose: Nose normal.  Eyes:     General: No scleral icterus.    Conjunctiva/sclera: Conjunctivae normal.  Neck:     Thyroid: No thyromegaly.  Cardiovascular:     Rate and Rhythm: Normal rate and regular rhythm.     Heart sounds: Normal heart sounds.  Pulmonary:     Effort: Pulmonary effort is normal.  Abdominal:     Palpations: Abdomen is soft.  Skin:    General: Skin is warm and dry.  Neurological:     General: No focal deficit present.     Mental Status: She is alert and oriented to person, place, and time. Mental status is at baseline.  Psychiatric:        Mood and Affect: Mood normal.        Behavior: Behavior normal.        Thought Content: Thought content normal.        Judgment: Judgment normal.         Assessment & Plan     1. Cystitis  -  Urine Culture  2. Acute pain of right shoulder May need orthopedic referral. - DG Shoulder Right; Future  3. Dementia with behavioral disturbance, unspecified dementia type (HCC) MMSE today is 29/30.  Relatively stable.  4. Depression, unspecified depression type Change citalopram to duloxetine.  1 month.  More than 50% of 25 minutes spent in counseling and coordination of care in this visit. - DULoxetine (CYMBALTA) 20 MG capsule; Take 1 capsule (20 mg total) by mouth daily.  Dispense: 30 capsule; Refill: 3 5.  Tobacco abuse Patient strongly advised to quit smoking.   I have done the exam and reviewed the above chart and it is accurate to the best of my knowledge. Dentist has been used in this note in any air is in the dictation or transcription are unintentional.  Megan Mans, MD  [facility]

## 2018-11-11 ENCOUNTER — Ambulatory Visit
Admission: RE | Admit: 2018-11-11 | Discharge: 2018-11-11 | Disposition: A | Discharge status: Home / Self Care | Payer: Medicare Other | Attending: Family Medicine | Admitting: Family Medicine

## 2018-11-11 ENCOUNTER — Ambulatory Visit
Admission: RE | Admit: 2018-11-11 | Discharge: 2018-11-11 | Disposition: A | Discharge status: Home / Self Care | Payer: Medicare Other | Source: Ambulatory Visit | Attending: Family Medicine | Admitting: Family Medicine

## 2018-11-11 DIAGNOSIS — Z9889 Other specified postprocedural states: Secondary | ICD-10-CM | POA: Diagnosis not present

## 2018-11-11 DIAGNOSIS — I25110 Atherosclerotic heart disease of native coronary artery with unstable angina pectoris: Secondary | ICD-10-CM | POA: Diagnosis not present

## 2018-11-11 DIAGNOSIS — M25511 Pain in right shoulder: Secondary | ICD-10-CM

## 2018-11-11 DIAGNOSIS — M25519 Pain in unspecified shoulder: Secondary | ICD-10-CM | POA: Diagnosis not present

## 2018-11-11 DIAGNOSIS — E7800 Pure hypercholesterolemia, unspecified: Secondary | ICD-10-CM | POA: Diagnosis not present

## 2018-11-11 DIAGNOSIS — R0602 Shortness of breath: Secondary | ICD-10-CM | POA: Diagnosis not present

## 2018-11-11 DIAGNOSIS — Z720 Tobacco use: Secondary | ICD-10-CM | POA: Diagnosis not present

## 2018-11-11 MED ORDER — DULOXETINE HCL 20 MG PO CPEP: 20.0000 mg | ORAL_CAPSULE | Freq: Every day | ORAL | 3 refills | Status: DC

## 2018-11-12 ENCOUNTER — Telehealth: Payer: Self-pay

## 2018-11-12 NOTE — Telephone Encounter (Signed)
-----   Message from Maple Hudson., MD sent at 11/12/2018  1:04 PM EST ----- Moderate arthritic changes noted.

## 2018-11-12 NOTE — Telephone Encounter (Signed)
Patient has been advised. KW 

## 2018-11-13 LAB — URINE CULTURE: Organism ID, Bacteria: NO GROWTH

## 2018-11-16 ENCOUNTER — Telehealth: Payer: Self-pay

## 2018-11-16 NOTE — Telephone Encounter (Signed)
-----   Message from Maple Hudson., MD sent at 11/13/2018 12:44 PM EST ----- Urine culture normal.

## 2018-11-16 NOTE — Telephone Encounter (Signed)
LVMTRC 

## 2018-11-17 NOTE — Telephone Encounter (Signed)
pt returned call ° °teri ° °

## 2018-11-17 NOTE — Telephone Encounter (Signed)
Patient was advised.  

## 2018-11-20 ENCOUNTER — Telehealth: Payer: Self-pay

## 2018-11-20 NOTE — Telephone Encounter (Signed)
Call pt regarding lung screening. Left message for pt to return call.  

## 2018-11-23 ENCOUNTER — Telehealth: Payer: Self-pay | Admitting: *Deleted

## 2018-11-23 DIAGNOSIS — Z122 Encounter for screening for malignant neoplasm of respiratory organs: Secondary | ICD-10-CM

## 2018-11-23 DIAGNOSIS — Z87891 Personal history of nicotine dependence: Secondary | ICD-10-CM

## 2018-11-23 NOTE — Telephone Encounter (Signed)
Patient has been notified that annual lung cancer screening low dose CT scan is due currently or will be in near future. Confirmed that patient is within the age range of 35-80, and asymptomatic, (no signs or symptoms of lung cancer). Patient denies illness that would prevent curative treatment for lung cancer if found. Verified smoking history, (current, 56.5 pack year). The shared decision making visit was done 10/15/16. Patient is agreeable for CT scan being scheduled.

## 2018-11-25 ENCOUNTER — Ambulatory Visit: Admission: RE | Admit: 2018-11-25 | Payer: Medicare Other | Source: Ambulatory Visit

## 2018-11-26 ENCOUNTER — Other Ambulatory Visit: Payer: Self-pay | Admitting: Family Medicine

## 2018-11-26 NOTE — Telephone Encounter (Signed)
Pharmacy requesting refills. Thanks!  

## 2018-12-01 ENCOUNTER — Ambulatory Visit: Payer: Self-pay | Admitting: Family Medicine

## 2018-12-01 ENCOUNTER — Ambulatory Visit: Payer: Medicare Other | Attending: Nurse Practitioner

## 2018-12-02 ENCOUNTER — Ambulatory Visit: Payer: Self-pay | Admitting: Family Medicine

## 2018-12-07 ENCOUNTER — Telehealth: Payer: Self-pay | Admitting: *Deleted

## 2018-12-07 NOTE — Telephone Encounter (Signed)
Pt was a no show to ldct screening today on 12-07-2018. Spoke with dil and she reported that she had a change in caregivers and would see how things were going since the pt has dementia and call us back to reschedule

## 2018-12-09 ENCOUNTER — Other Ambulatory Visit: Payer: Self-pay | Admitting: Family Medicine

## 2018-12-09 DIAGNOSIS — G8929 Other chronic pain: Secondary | ICD-10-CM

## 2018-12-09 NOTE — Telephone Encounter (Signed)
Pharmacy requesting refills. Thanks!  

## 2018-12-17 ENCOUNTER — Encounter: Payer: Self-pay | Admitting: *Deleted

## 2018-12-31 ENCOUNTER — Encounter: Payer: Self-pay | Admitting: *Deleted

## 2019-01-25 ENCOUNTER — Other Ambulatory Visit: Payer: Self-pay | Admitting: Family Medicine

## 2019-01-25 DIAGNOSIS — F419 Anxiety disorder, unspecified: Secondary | ICD-10-CM

## 2019-01-26 NOTE — Telephone Encounter (Signed)
Pharmacy requesting refills. Thanks!  

## 2019-02-22 ENCOUNTER — Other Ambulatory Visit: Payer: Self-pay | Admitting: Family Medicine

## 2019-02-22 DIAGNOSIS — F419 Anxiety disorder, unspecified: Secondary | ICD-10-CM

## 2019-02-22 NOTE — Telephone Encounter (Signed)
Pharmacy requesting refills. Thanks!  

## 2019-03-11 ENCOUNTER — Ambulatory Visit (INDEPENDENT_AMBULATORY_CARE_PROVIDER_SITE_OTHER): Payer: Medicare Other | Admitting: Family Medicine

## 2019-03-11 ENCOUNTER — Encounter: Payer: Self-pay | Admitting: Family Medicine

## 2019-03-11 DIAGNOSIS — I10 Essential (primary) hypertension: Secondary | ICD-10-CM | POA: Diagnosis not present

## 2019-03-11 MED ORDER — AMLODIPINE BESYLATE 5 MG PO TABS: 5.0000 mg | ORAL_TABLET | Freq: Every day | ORAL | 1 refills | Status: DC

## 2019-03-11 NOTE — Progress Notes (Signed)
Patient: Brittany Acevedo Female    DOB: 06-Nov-1940   78 y.o.   MRN: [id number] Visit Date: 03/11/2019  Today's Provider: Shirlee LatchAngela , MD   Chief Complaint  Patient presents with  . Hypertension  . Dizziness  . Fatigue   Subjective:    Virtual Visit via Video Note  I connected with Brittany Acevedo on 03/11/19 at  3:20 PM EDT by a video enabled telemedicine application and verified that I am speaking with the correct person using two identifiers.   Patient location: home [facility] Persons involved in the visit: patient, provider   I discussed the limitations of evaluation and management by telemedicine and the availability of in person appointments. The patient expressed understanding and agreed to proceed.    HPI  Hypertension, follow-up:  BP Readings from Last 3 Encounters:  11/10/18 122/70  09/03/18 116/70  05/07/18 (!) 124/58    She was last seen for hypertension 4 months ago.  BP at that visit was 122/70. Outside blood pressures are being checked at home. She is experiencing dizziness, nausea and elevated BP reading of 195/104 today. .  Patient denies chest pain, chest pressure/discomfort, claudication, dyspnea, exertional chest pressure/discomfort, irregular heart beat, lower extremity edema, near-syncope, orthopnea, palpitations, paroxysmal nocturnal dyspnea, syncope and tachypnea.   Cardiovascular risk factors include advanced age (older than 10455 for men, 8865 for women), dyslipidemia, hypertension and obesity (BMI >= 30 kg/m2).  Use of agents associated with hypertension: none.     Weight trend: stable Wt Readings from Last 3 Encounters:  11/10/18 145 lb (65.8 kg)  09/03/18 135 lb (61.2 kg)  05/07/18 143 lb 3.2 oz (65 kg)    ------------------------------------------------------------------------  Allergies  Allergen Reactions  . Gabapentin Anaphylaxis    Throat closes  . Darvon [Propoxyphene]     hives   . Shellfish Allergy     "causes problems with my fibromyalgia"     Current Outpatient Medications:  .  albuterol (PROVENTIL HFA;VENTOLIN HFA) 108 (90 Base) MCG/ACT inhaler, Inhale 2 puffs into the lungs every 6 (six) hours as needed for wheezing or shortness of breath., Disp: 18 g, Rfl: 3 .  aspirin 325 MG tablet, Take 325 mg by mouth daily., Disp: , Rfl:  .  atorvastatin (LIPITOR) 10 MG tablet, Take 1 tablet (10 mg total) by mouth daily., Disp: 90 tablet, Rfl: 3 .  buPROPion (WELLBUTRIN SR) 100 MG 12 hr tablet, Take 100 mg by mouth 2 (two) times daily. , Disp: , Rfl:  .  buPROPion (WELLBUTRIN SR) 150 MG 12 hr tablet, Take 1 tablet (150 mg total) by mouth 2 (two) times daily., Disp: 60 tablet, Rfl: 5 .  calcium-vitamin D (OSCAL) 250-125 MG-UNIT per tablet, Take 1 tablet by mouth daily., Disp: , Rfl:  .  clonazePAM (KLONOPIN) 0.5 MG tablet, Take 1 tablet (0.5 mg total) by mouth 2 (two) times daily., Disp: 60 tablet, Rfl: 0 .  clopidogrel (PLAVIX) 75 MG tablet, 1 Tablet, Oral, daily, by mouth, Disp: 90 tablet, Rfl: 3 .  donepezil (ARICEPT) 10 MG tablet, Take 1 tablet (10 mg total) by mouth at bedtime., Disp: 30 tablet, Rfl: 5 .  doxycycline (VIBRA-TABS) 100 MG tablet, Take 1 tablet (100 mg total) by mouth 2 (two) times daily., Disp: 20 tablet, Rfl: 0 .  DULoxetine (CYMBALTA) 20 MG capsule, Take 1 capsule (20 mg total) by mouth daily., Disp: 30 capsule, Rfl: 3 .  ferrous sulfate 325 (65 FE) MG  tablet, Take 1 tablet (325 mg total) by mouth 2 (two) times daily with a meal. (Patient taking differently: Take 325 mg by mouth daily with breakfast. ), Disp: 60 tablet, Rfl: 5 .  fluticasone (FLONASE) 50 MCG/ACT nasal spray, Place into the nose as needed. , Disp: , Rfl:  .  glucose blood test strip, Use as instructed, Disp: 100 each, Rfl: 12 .  isosorbide dinitrate (ISORDIL) 30 MG tablet, Take 30 mg by mouth daily., Disp: , Rfl:  .  isosorbide mononitrate (IMDUR) 30 MG 24 hr tablet, Take 1 tablet (30 mg  total) by mouth daily., Disp: 90 tablet, Rfl: 3 .  magnesium citrate SOLN, Take 148 mLs (0.5 Bottles total) by mouth once. And repeat in 6 hours if needed., Disp: 195 mL, Rfl: 1 .  metoprolol tartrate (LOPRESSOR) 25 MG tablet, Take 0.5 tablets (12.5 mg total) by mouth 2 (two) times daily., Disp: 90 tablet, Rfl: 3 .  nitroGLYCERIN (NITROSTAT) 0.4 MG SL tablet, Place 1 tablet (0.4 mg total) under the tongue every 5 (five) minutes as needed for chest pain., Disp: 25 tablet, Rfl: 11 .  Omega-3 Fatty Acids (FISH OIL) 1000 MG CAPS, Take by mouth daily. , Disp: , Rfl:  .  ONETOUCH DELICA LANCETS 33G MISC, Use as instructed, Disp: 100 each, Rfl: 11 .  pantoprazole (PROTONIX) 40 MG tablet, Take 1 tablet (40 mg total) by mouth daily., Disp: 90 tablet, Rfl: 0 .  traMADol (ULTRAM) 50 MG tablet, TAKE (1) TABLET EVERY SIX HOURS AS NEEDED., Disp: 120 tablet, Rfl: 0 .  traMADol (ULTRAM) 50 MG tablet, TAKE (1) TABLET EVERY SIX HOURS AS NEEDED., Disp: 120 tablet, Rfl: 3 .  amLODipine (NORVASC) 5 MG tablet, Take 1 tablet (5 mg total) by mouth daily., Disp: 30 tablet, Rfl: 1  Review of Systems  Constitutional: Positive for fatigue.  Respiratory: Negative for chest tightness and shortness of breath.   Cardiovascular: Negative.  Negative for chest pain.  Genitourinary: Negative for decreased urine volume and difficulty urinating.  Musculoskeletal: Negative.   Neurological: Negative for dizziness, facial asymmetry, speech difficulty, weakness, light-headedness, numbness and headaches.    Social History   Tobacco Use  . Smoking status: Current Every Day Smoker    Packs/day: 1.50    Years: 55.00    Pack years: 82.50    Types: Cigarettes  . Smokeless tobacco: Never Used  Substance Use Topics  . Alcohol use: No      Objective:   There were no vitals taken for this visit. There were no vitals filed for this visit.   Physical Exam Constitutional:      Appearance: Normal appearance.  Neurological:      Mental Status: She is alert.     Comments: Face appears symmetric, answers questions appropriately  Psychiatric:        Mood and Affect: Mood normal.        Behavior: Behavior normal.         Assessment & Plan  Follow Up Instructions: I discussed the assessment and treatment plan with the patient. The patient was provided an opportunity to ask questions and all were answered. The patient agreed with the plan and demonstrated an understanding of the instructions.   The patient was advised to call back or seek an in-person evaluation if the symptoms worsen or if the condition fails to improve as anticipated.  1. Accelerated hypertension -New problem -Seems that previous blood pressures in the office have been well controlled -Patient does have dementia  and her daughter is concerned that she may not be taking her medications appropriately, but do not suspect that missing her small dose of metoprolol would make this difference in her blood pressure -She is asymptomatic and we discussed extensively that if she develops any signs or symptoms of stroke or heart attack, she will need to go straight to the emergency room - We will continue her current medications and add amlodipine 5 mg daily - Follow-up in 2 weeks and consider dose titration - Discussed return precautions and avoidance of any decongestants or OTC meds that may raise her blood pressure in the meantime    Meds ordered this encounter  Medications  . amLODipine (NORVASC) 5 MG tablet    Sig: Take 1 tablet (5 mg total) by mouth daily.    Dispense:  30 tablet    Refill:  1     Return in about 2 weeks (around 03/25/2019) for BP f/u.   The entirety of the information documented in the History of Present Illness, Review of Systems and Physical Exam were personally obtained by me. Portions of this information were initially documented by Presley Raddle, CMA and reviewed by me for thoroughness and accuracy.    , Marzella Schlein, MD MPH [facility]

## 2019-03-22 ENCOUNTER — Other Ambulatory Visit: Payer: Self-pay | Admitting: Family Medicine

## 2019-03-22 DIAGNOSIS — F419 Anxiety disorder, unspecified: Secondary | ICD-10-CM

## 2019-03-22 NOTE — Telephone Encounter (Signed)
Pharmacy requesting refills. Thanks!  

## 2019-03-23 NOTE — Telephone Encounter (Signed)
Please review

## 2019-03-25 NOTE — Progress Notes (Signed)
Patient: Brittany Acevedo J Whitehouse Female    DOB: [birth year]   78 y.o.   MRN: [id number] Visit Date: 03/26/2019  Today's Provider: Shirlee LatchAngela Arnell Mausolf, MD   Chief Complaint  Patient presents with  . Hypertension   Subjective:    Virtual Visit via Video Note  I connected with Brittany PaganNancy J Pollan on 03/26/19 at  3:40 PM EDT by a video enabled telemedicine application and verified that I am speaking with the correct person using two identifiers.   Patient location: home [facility] Persons involved in the visit: patient, provider, patient's daughter   I discussed the limitations of evaluation and management by telemedicine and the availability of in person appointments. The patient expressed understanding and agreed to proceed.  HPI  Hypertension, follow-up:     BP Readings from Last 3 Encounters:  11/10/18 122/70  09/03/18 116/70  05/07/18 (!) 124/58    She was last seen for hypertension 2 weeks ago.  BP at that visit was 122/70. Added Amlodipine 5 mg daily.  Outside blood pressures are being checked at home. She is experiencing confusion. Patient denies chest pain, chest pressure/discomfort, claudication, dyspnea, exertional chest pressure/discomfort, irregular heart beat, lower extremity edema, near-syncope, orthopnea, palpitations, paroxysmal nocturnal dyspnea, syncope and tachypnea.   Cardiovascular risk factors include advanced age (older than 6155 for men, 265 for women), dyslipidemia, hypertension and obesity (BMI >= 30 kg/m2).  Use of agents associated with hypertension: none.               Weight trend: stable    Wt Readings from Last 3 Encounters:  11/10/18 145 lb (65.8 kg)  09/03/18 135 lb (61.2 kg)  05/07/18 143 lb 3.2 oz (65 kg)   ------------------------------------------------------------------------  Hallucinations last week, none since Worried about UTI States that she doesn't bathe as often as she should +back pain, urinary  hesistancy Subjective fever Taking tylenol q6h Feels not as sharp as she used to be  Vision not as good as it used to be  Allergies  Allergen Reactions  . Gabapentin Anaphylaxis    Throat closes  . Darvon [Propoxyphene]     hives  . Shellfish Allergy     "causes problems with my fibromyalgia"     Current Outpatient Medications:  .  albuterol (PROVENTIL HFA;VENTOLIN HFA) 108 (90 Base) MCG/ACT inhaler, Inhale 2 puffs into the lungs every 6 (six) hours as needed for wheezing or shortness of breath., Disp: 18 g, Rfl: 3 .  amLODipine (NORVASC) 5 MG tablet, Take 1 tablet (5 mg total) by mouth daily., Disp: 30 tablet, Rfl: 1 .  aspirin 325 MG tablet, Take 325 mg by mouth daily., Disp: , Rfl:  .  atorvastatin (LIPITOR) 10 MG tablet, Take 1 tablet (10 mg total) by mouth daily., Disp: 90 tablet, Rfl: 3 .  buPROPion (WELLBUTRIN SR) 100 MG 12 hr tablet, Take 100 mg by mouth 2 (two) times daily. , Disp: , Rfl:  .  buPROPion (WELLBUTRIN SR) 150 MG 12 hr tablet, Take 1 tablet (150 mg total) by mouth 2 (two) times daily., Disp: 60 tablet, Rfl: 1 .  calcium-vitamin D (OSCAL) 250-125 MG-UNIT per tablet, Take 1 tablet by mouth daily., Disp: , Rfl:  .  clonazePAM (KLONOPIN) 0.5 MG tablet, Take 1 tablet (0.5 mg total) by mouth 2 (two) times daily., Disp: 60 tablet, Rfl: 4 .  clopidogrel (PLAVIX) 75 MG tablet, 1 Tablet, Oral, daily, by mouth, Disp: 90 tablet, Rfl: 3 .  donepezil (ARICEPT) 10 MG tablet, Take 1 tablet (10 mg total) by mouth at bedtime., Disp: 30 tablet, Rfl: 5 .  doxycycline (VIBRA-TABS) 100 MG tablet, Take 1 tablet (100 mg total) by mouth 2 (two) times daily., Disp: 20 tablet, Rfl: 0 .  DULoxetine (CYMBALTA) 20 MG capsule, Take 1 capsule (20 mg total) by mouth daily., Disp: 30 capsule, Rfl: 3 .  ferrous sulfate 325 (65 FE) MG tablet, Take 1 tablet (325 mg total) by mouth 2 (two) times daily with a meal. (Patient taking differently: Take 325 mg by mouth daily with breakfast. ), Disp: 60  tablet, Rfl: 5 .  fluticasone (FLONASE) 50 MCG/ACT nasal spray, Place into the nose as needed. , Disp: , Rfl:  .  glucose blood test strip, Use as instructed, Disp: 100 each, Rfl: 12 .  isosorbide dinitrate (ISORDIL) 30 MG tablet, Take 30 mg by mouth daily., Disp: , Rfl:  .  isosorbide mononitrate (IMDUR) 30 MG 24 hr tablet, Take 1 tablet (30 mg total) by mouth daily., Disp: 90 tablet, Rfl: 3 .  magnesium citrate SOLN, Take 148 mLs (0.5 Bottles total) by mouth once. And repeat in 6 hours if needed., Disp: 195 mL, Rfl: 1 .  metoprolol tartrate (LOPRESSOR) 25 MG tablet, Take 0.5 tablets (12.5 mg total) by mouth 2 (two) times daily., Disp: 90 tablet, Rfl: 3 .  nitroGLYCERIN (NITROSTAT) 0.4 MG SL tablet, Place 1 tablet (0.4 mg total) under the tongue every 5 (five) minutes as needed for chest pain., Disp: 25 tablet, Rfl: 11 .  Omega-3 Fatty Acids (FISH OIL) 1000 MG CAPS, Take by mouth daily. , Disp: , Rfl:  .  ONETOUCH DELICA LANCETS 16W MISC, Use as instructed, Disp: 100 each, Rfl: 11 .  pantoprazole (PROTONIX) 40 MG tablet, Take 1 tablet (40 mg total) by mouth daily., Disp: 90 tablet, Rfl: 0 .  traMADol (ULTRAM) 50 MG tablet, TAKE (1) TABLET EVERY SIX HOURS AS NEEDED., Disp: 120 tablet, Rfl: 0 .  traMADol (ULTRAM) 50 MG tablet, TAKE (1) TABLET EVERY SIX HOURS AS NEEDED., Disp: 120 tablet, Rfl: 3 .  cephALEXin (KEFLEX) 500 MG capsule, Take 1 capsule (500 mg total) by mouth 2 (two) times daily for 7 days., Disp: 14 capsule, Rfl: 0   Review of Systems  Constitutional: Negative.   Respiratory: Negative.   Cardiovascular: Negative.   Musculoskeletal: Negative.     Social History   Tobacco Use  . Smoking status: Current Every Day Smoker    Packs/day: 1.50    Years: 55.00    Pack years: 82.50    Types: Cigarettes  . Smokeless tobacco: Never Used  Substance Use Topics  . Alcohol use: No      Objective:   BP (!) 142/79  Vitals:   03/26/19 1334  BP: (!) 142/79     Physical Exam  Constitutional:      Appearance: Normal appearance.  Pulmonary:     Effort: Pulmonary effort is normal. No respiratory distress.  Neurological:     Mental Status: She is alert and oriented to person, place, and time. Mental status is at baseline.  Psychiatric:        Mood and Affect: Mood normal.        Behavior: Behavior normal.    Results for orders placed or performed in visit on 03/26/19  POCT Urinalysis Dipstick  Result Value Ref Range   Color, UA dark yellow    Clarity, UA hazy    Glucose, UA Negative Negative  Bilirubin, UA negative    Ketones, UA negative    Spec Grav, UA >=1.030 (A) 1.010 - 1.025   Blood, UA hemolyzed moderate    pH, UA 6.0 5.0 - 8.0   Protein, UA Positive (A) Negative   Urobilinogen, UA 0.2 0.2 or 1.0 E.U./dL   Nitrite, UA negative    Leukocytes, UA Negative Negative   Appearance     Odor          Assessment & Plan    I discussed the assessment and treatment plan with the patient. The patient was provided an opportunity to ask questions and all were answered. The patient agreed with the plan and demonstrated an understanding of the instructions.   The patient was advised to call back or seek an in-person evaluation if the symptoms worsen or if the condition fails to improve as anticipated.  Problem List Items Addressed This Visit      Cardiovascular and Mediastinum   Hypertension - Primary    Well-controlled on home readings Discussed higher goal, given her advanced age and dementia Goal of less than 150/90 Continue current medications at current dose Discussed that we need to be cautious with adjusting her antihypertensives given her fall risk Recheck metabolic panel at next in-person visit        Nervous and Auditory   Dementia [facility])    Patient with some recent delirium with hallucinations Suspect this is related to her likely UTI as below It has not recurred since that time Daughter given return precautions and symptoms to watch  for        Other   Hypercholesteremia    Continue statin and aspirin given significant heart history despite dementia Recheck metabolic panel and lipid panel at next in-person visit      Hyperglycemia    Patient with prediabetes Not currently on any medications for this Recheck A1c at next in-person visit Encourage low-carb diet       Other Visit Diagnoses    Cystitis       Relevant Orders   POCT Urinalysis Dipstick (Completed)   Urine Culture   Urinalysis, microscopic only    - Symptoms and UA consistent with UTI -No systemic symptoms or signs of pyelonephritis - Given hematuria, will send urine micro to confirm and will plan to recheck urine in about 6 weeks after completion of antibiotics to ensure hematuria has cleared -Will start treatment with 7 day course of Keflex-no previous urine cultures available for review -We will send urine culture to confirm sensitivities -Discussed return precautions    Return in about 2 months (around 05/26/2019) for BP f/u and possible labs.   The entirety of the information documented in the History of Present Illness, Review of Systems and Physical Exam were personally obtained by me. Portions of this information were initially documented by Presley Raddle, CMA and reviewed by me for thoroughness and accuracy.    Sundus Pete, Marzella Schlein, MD MPH [facility]

## 2019-03-26 ENCOUNTER — Ambulatory Visit (INDEPENDENT_AMBULATORY_CARE_PROVIDER_SITE_OTHER): Payer: Medicare Other | Admitting: Family Medicine

## 2019-03-26 ENCOUNTER — Encounter: Payer: Self-pay | Admitting: Family Medicine

## 2019-03-26 VITALS — BP 142/79

## 2019-03-26 DIAGNOSIS — R739 Hyperglycemia, unspecified: Secondary | ICD-10-CM

## 2019-03-26 DIAGNOSIS — R300 Dysuria: Secondary | ICD-10-CM | POA: Diagnosis not present

## 2019-03-26 DIAGNOSIS — F0391 Unspecified dementia with behavioral disturbance: Secondary | ICD-10-CM

## 2019-03-26 DIAGNOSIS — E7800 Pure hypercholesterolemia, unspecified: Secondary | ICD-10-CM

## 2019-03-26 DIAGNOSIS — I10 Essential (primary) hypertension: Secondary | ICD-10-CM

## 2019-03-26 DIAGNOSIS — N3090 Cystitis, unspecified without hematuria: Secondary | ICD-10-CM

## 2019-03-26 LAB — POCT URINALYSIS DIPSTICK
Bilirubin, UA: NEGATIVE
Glucose, UA: NEGATIVE
Ketones, UA: NEGATIVE
Leukocytes, UA: NEGATIVE
Nitrite, UA: NEGATIVE
Protein, UA: POSITIVE — AB
Spec Grav, UA: >=1.03 — AB (ref 1.010–1.025)
Urobilinogen, UA: 0.2 E.U./dL
pH, UA: 6 (ref 5.0–8.0)

## 2019-03-26 MED ORDER — CEPHALEXIN 500 MG PO CAPS: 500.0000 mg | ORAL_CAPSULE | Freq: Two times a day (BID) | ORAL | 0 refills | Status: AC

## 2019-03-26 NOTE — Assessment & Plan Note (Signed)
Patient with some recent delirium with hallucinations Suspect this is related to her likely UTI as below It has not recurred since that time Daughter given return precautions and symptoms to watch for

## 2019-03-26 NOTE — Assessment & Plan Note (Signed)
Well-controlled on home readings Discussed higher goal, given her advanced age and dementia Goal of less than 150/90 Continue current medications at current dose Discussed that we need to be cautious with adjusting her antihypertensives given her fall risk Recheck metabolic panel at next in-person visit

## 2019-03-26 NOTE — Assessment & Plan Note (Signed)
Continue statin and aspirin given significant heart history despite dementia Recheck metabolic panel and lipid panel at next in-person visit

## 2019-03-26 NOTE — Assessment & Plan Note (Signed)
Patient with prediabetes Not currently on any medications for this Recheck A1c at next in-person visit Encourage low-carb diet

## 2019-03-27 LAB — URINALYSIS, MICROSCOPIC ONLY: Bacteria, UA: NONE SEEN

## 2019-03-28 LAB — URINE CULTURE

## 2019-04-01 ENCOUNTER — Telehealth: Payer: Self-pay | Admitting: Family Medicine

## 2019-04-01 ENCOUNTER — Telehealth: Payer: Self-pay

## 2019-04-01 NOTE — Telephone Encounter (Signed)
LVMTRC 

## 2019-04-01 NOTE — Telephone Encounter (Signed)
Spoke to [place] at [employer]. Information given on correct dosage of Amlodipine.

## 2019-04-01 NOTE — Telephone Encounter (Signed)
-----   Message from Margo Common, [place] sent at 03/29/2019 12:59 PM EDT ----- Mixed bacterial flora on culture should respond to the Keflex Dr. Brita Romp prescribed 03-26-19. Drink plenty of fluids and recheck urinalysis after antibiotics finished if any symptoms remain.

## 2019-04-01 NOTE — Telephone Encounter (Signed)
5 that I can see.

## 2019-04-01 NOTE — Telephone Encounter (Signed)
Abigail Butts at [employer] called wanting to know if pt should be taking 5 mg or 10 mg of amlodipine daily.  CB#  [phone]  Ask for wendy

## 2019-04-21 ENCOUNTER — Other Ambulatory Visit: Payer: Self-pay | Admitting: Family Medicine

## 2019-04-21 DIAGNOSIS — G8929 Other chronic pain: Secondary | ICD-10-CM

## 2019-04-21 DIAGNOSIS — F0391 Unspecified dementia with behavioral disturbance: Secondary | ICD-10-CM

## 2019-04-21 DIAGNOSIS — F03918 Unspecified dementia, unspecified severity, with other behavioral disturbance: Secondary | ICD-10-CM

## 2019-04-22 ENCOUNTER — Other Ambulatory Visit: Payer: Self-pay | Admitting: Family Medicine

## 2019-04-28 ENCOUNTER — Other Ambulatory Visit: Payer: Self-pay | Admitting: Family Medicine

## 2019-05-26 ENCOUNTER — Ambulatory Visit: Payer: Medicare Other | Admitting: Family Medicine

## 2019-05-26 NOTE — Progress Notes (Deleted)
Patient: Brittany Acevedo Female    DOB: 27-May-1941   78 y.o.   MRN: [id number] Visit Date: 05/26/2019  Today's Provider: Wilhemena Durie, MD   No chief complaint on file.  Subjective:     HPI  Hypertension, follow-up:  BP Readings from Last 3 Encounters:  03/26/19 (!) 142/79  11/10/18 122/70  09/03/18 116/70    She was last seen for hypertension {NUMBERS 1-12:18279} {days/wks/mos/yrs:310907} ago.  BP at that visit was ***. Management changes since that visit include ***. She reports {excellent/good/fair/poor:19665} compliance with treatment. She {ACTION; IS/IS [id number]} having side effects. *** She {is/is not:9024} exercising. She {is/is not:9024} adherent to low salt diet.   Outside blood pressures are ***. She is experiencing {Symptoms; cardiac:12860}.  Patient denies {Symptoms; cardiac:12860}.   Cardiovascular risk factors include {cv risk factors:510}.  Use of agents associated with hypertension: {bp agents assoc with hypertension:511::"none"}.     Weight trend: {trend:16658} Wt Readings from Last 3 Encounters:  11/10/18 145 lb (65.8 kg)  09/03/18 135 lb (61.2 kg)  05/07/18 143 lb 3.2 oz (65 kg)    Current diet: {diet habits:16563}  ------------------------------------------------------------------------   Prediabetes, Follow-up:   Lab Results  Component Value Date   HGBA1C 6.1 07/10/2017   HGBA1C 5.9 02/03/2017   HGBA1C 5.9 (H) 10/15/2016   GLUCOSE 101 (H) 09/04/2018   GLUCOSE 81 01/07/2018   GLUCOSE 108 (H) 10/15/2016    Last seen for for this{1-12:18279} {days/wks/mos/yrs:310907} ago.  Management since that visit includes ***. Current symptoms include {Symptoms; diabetes:14075} and have been {Desc; course:15616}.  Weight trend: {trend:16658} Prior visit with dietician: {yes/no:17258} Current diet: {diet habits:16563} Current exercise: {exercise types:16438}  Pertinent Labs:    Component Value Date/Time   CHOL 205 (H)  09/04/2018 0926   TRIG 163 (H) 09/04/2018 0926   CHOLHDL 6.2 (H) 09/04/2018 0926   CREATININE 0.97 09/04/2018 0926   CREATININE 0.86 03/20/2014 2232    Wt Readings from Last 3 Encounters:  11/10/18 145 lb (65.8 kg)  09/03/18 135 lb (61.2 kg)  05/07/18 143 lb 3.2 oz (65 kg)    Allergies  Allergen Reactions  . Gabapentin Anaphylaxis    Throat closes  . Darvon [Propoxyphene]     hives  . Shellfish Allergy     "causes problems with my fibromyalgia"     Current Outpatient Medications:  .  albuterol (PROVENTIL HFA;VENTOLIN HFA) 108 (90 Base) MCG/ACT inhaler, Inhale 2 puffs into the lungs every 6 (six) hours as needed for wheezing or shortness of breath., Disp: 18 g, Rfl: 3 .  amLODipine (NORVASC) 5 MG tablet, Take 1 tablet (5 mg total) by mouth daily., Disp: 30 tablet, Rfl: 5 .  aspirin 325 MG tablet, Take 325 mg by mouth daily., Disp: , Rfl:  .  atorvastatin (LIPITOR) 10 MG tablet, Take 1 tablet (10 mg total) by mouth daily., Disp: 90 tablet, Rfl: 3 .  buPROPion (WELLBUTRIN SR) 100 MG 12 hr tablet, Take 100 mg by mouth 2 (two) times daily. , Disp: , Rfl:  .  buPROPion (WELLBUTRIN SR) 150 MG 12 hr tablet, Take 1 tablet (150 mg total) by mouth 2 (two) times daily., Disp: 60 tablet, Rfl: 1 .  calcium-vitamin D (OSCAL) 250-125 MG-UNIT per tablet, Take 1 tablet by mouth daily., Disp: , Rfl:  .  clonazePAM (KLONOPIN) 0.5 MG tablet, Take 1 tablet (0.5 mg total) by mouth 2 (two) times daily., Disp: 60 tablet, Rfl: 4 .  clopidogrel (PLAVIX) 75 MG tablet,  1 Tablet, Oral, daily, by mouth, Disp: 90 tablet, Rfl: 3 .  donepezil (ARICEPT) 10 MG tablet, Take 1 tablet (10 mg total) by mouth at bedtime., Disp: 30 tablet, Rfl: 0 .  doxycycline (VIBRA-TABS) 100 MG tablet, Take 1 tablet (100 mg total) by mouth 2 (two) times daily., Disp: 20 tablet, Rfl: 0 .  DULoxetine (CYMBALTA) 20 MG capsule, Take 1 capsule (20 mg total) by mouth daily., Disp: 30 capsule, Rfl: 3 .  ferrous sulfate 325 (65 FE) MG tablet,  Take 1 tablet (325 mg total) by mouth 2 (two) times daily with a meal. (Patient taking differently: Take 325 mg by mouth daily with breakfast. ), Disp: 60 tablet, Rfl: 5 .  fluticasone (FLONASE) 50 MCG/ACT nasal spray, Place into the nose as needed. , Disp: , Rfl:  .  glucose blood test strip, Use as instructed, Disp: 100 each, Rfl: 12 .  isosorbide dinitrate (ISORDIL) 30 MG tablet, Take 30 mg by mouth daily., Disp: , Rfl:  .  isosorbide mononitrate (IMDUR) 30 MG 24 hr tablet, Take 1 tablet (30 mg total) by mouth daily., Disp: 90 tablet, Rfl: 3 .  magnesium citrate SOLN, Take 148 mLs (0.5 Bottles total) by mouth once. And repeat in 6 hours if needed., Disp: 195 mL, Rfl: 1 .  metoprolol tartrate (LOPRESSOR) 25 MG tablet, Take 0.5 tablets (12.5 mg total) by mouth 2 (two) times daily., Disp: 90 tablet, Rfl: 3 .  nitroGLYCERIN (NITROSTAT) 0.4 MG SL tablet, Place 1 tablet (0.4 mg total) under the tongue every 5 (five) minutes as needed for chest pain., Disp: 25 tablet, Rfl: 11 .  Omega-3 Fatty Acids (FISH OIL) 1000 MG CAPS, Take by mouth daily. , Disp: , Rfl:  .  ONETOUCH DELICA LANCETS 33G MISC, Use as instructed, Disp: 100 each, Rfl: 11 .  pantoprazole (PROTONIX) 40 MG tablet, Take 1 tablet (40 mg total) by mouth daily., Disp: 90 tablet, Rfl: 0 .  traMADol (ULTRAM) 50 MG tablet, TAKE (1) TABLET EVERY SIX HOURS AS NEEDED., Disp: 120 tablet, Rfl: 0 .  traMADol (ULTRAM) 50 MG tablet, TAKE (1) TABLET EVERY SIX HOURS AS NEEDED., Disp: 120 tablet, Rfl: 3  Review of Systems  Social History   Tobacco Use  . Smoking status: Current Every Day Smoker    Packs/day: 1.50    Years: 55.00    Pack years: 82.50    Types: Cigarettes  . Smokeless tobacco: Never Used  Substance Use Topics  . Alcohol use: No      Objective:   There were no vitals taken for this visit. There were no vitals filed for this visit.   Physical Exam   No results found for any visits on 05/26/19.     Assessment & Plan         Megan Mans, MD  [facility]

## 2019-06-22 ENCOUNTER — Other Ambulatory Visit: Payer: Self-pay | Admitting: Family Medicine

## 2019-06-22 DIAGNOSIS — F0391 Unspecified dementia with behavioral disturbance: Secondary | ICD-10-CM

## 2019-06-22 DIAGNOSIS — F03918 Unspecified dementia, unspecified severity, with other behavioral disturbance: Secondary | ICD-10-CM

## 2019-07-19 ENCOUNTER — Other Ambulatory Visit: Payer: Self-pay | Admitting: Family Medicine

## 2019-08-17 ENCOUNTER — Other Ambulatory Visit: Payer: Self-pay | Admitting: Family Medicine

## 2019-09-13 ENCOUNTER — Other Ambulatory Visit: Payer: Self-pay | Admitting: Family Medicine

## 2019-09-13 DIAGNOSIS — F419 Anxiety disorder, unspecified: Secondary | ICD-10-CM

## 2019-09-13 NOTE — Telephone Encounter (Signed)
Please review for refill for controlled medication : tramadol 50 mg tab and clonazepam 0.5mg  tab. Pt also need urine drug screen for these medications per protocol.

## 2019-09-26 ENCOUNTER — Other Ambulatory Visit: Payer: Self-pay

## 2019-09-26 ENCOUNTER — Emergency Department
Admission: EM | Admit: 2019-09-26 | Discharge: 2019-09-26 | Disposition: A | Discharge status: Home / Self Care | Payer: Medicare Other | Attending: Emergency Medicine | Admitting: Emergency Medicine

## 2019-09-26 DIAGNOSIS — F17210 Nicotine dependence, cigarettes, uncomplicated: Secondary | ICD-10-CM | POA: Diagnosis not present

## 2019-09-26 DIAGNOSIS — I259 Chronic ischemic heart disease, unspecified: Secondary | ICD-10-CM | POA: Insufficient documentation

## 2019-09-26 DIAGNOSIS — Z951 Presence of aortocoronary bypass graft: Secondary | ICD-10-CM | POA: Insufficient documentation

## 2019-09-26 DIAGNOSIS — R0902 Hypoxemia: Secondary | ICD-10-CM | POA: Diagnosis not present

## 2019-09-26 DIAGNOSIS — Z7902 Long term (current) use of antithrombotics/antiplatelets: Secondary | ICD-10-CM | POA: Insufficient documentation

## 2019-09-26 DIAGNOSIS — R404 Transient alteration of awareness: Secondary | ICD-10-CM | POA: Diagnosis not present

## 2019-09-26 DIAGNOSIS — Z79899 Other long term (current) drug therapy: Secondary | ICD-10-CM | POA: Insufficient documentation

## 2019-09-26 DIAGNOSIS — E860 Dehydration: Secondary | ICD-10-CM | POA: Diagnosis not present

## 2019-09-26 DIAGNOSIS — J45909 Unspecified asthma, uncomplicated: Secondary | ICD-10-CM | POA: Diagnosis not present

## 2019-09-26 DIAGNOSIS — F0390 Unspecified dementia without behavioral disturbance: Secondary | ICD-10-CM | POA: Insufficient documentation

## 2019-09-26 DIAGNOSIS — I10 Essential (primary) hypertension: Secondary | ICD-10-CM | POA: Insufficient documentation

## 2019-09-26 DIAGNOSIS — R55 Syncope and collapse: Secondary | ICD-10-CM | POA: Insufficient documentation

## 2019-09-26 DIAGNOSIS — I4510 Unspecified right bundle-branch block: Secondary | ICD-10-CM | POA: Diagnosis not present

## 2019-09-26 DIAGNOSIS — R000 Tachycardia, unspecified: Secondary | ICD-10-CM | POA: Diagnosis not present

## 2019-09-26 DIAGNOSIS — Z743 Need for continuous supervision: Secondary | ICD-10-CM | POA: Diagnosis not present

## 2019-09-26 LAB — BASIC METABOLIC PANEL
Anion gap: 10 (ref 5–15)
Anion gap: 10 (ref 5–15)
BUN: 25 mg/dL — ABNORMAL HIGH (ref 8–23)
BUN: 23 mg/dL (ref 8–23)
CO2: 23 mmol/L (ref 22–32)
CO2: 23 mmol/L (ref 22–32)
Calcium: 8.6 mg/dL — ABNORMAL LOW (ref 8.9–10.3)
Calcium: 8.5 mg/dL — ABNORMAL LOW (ref 8.9–10.3)
Chloride: 104 mmol/L (ref 98–111)
Chloride: 107 mmol/L (ref 98–111)
Creatinine, Ser: 1.84 mg/dL — ABNORMAL HIGH (ref 0.44–1.00)
Creatinine, Ser: 1.7 mg/dL — ABNORMAL HIGH (ref 0.44–1.00)
GFR calc Af Amer: 30 mL/min — ABNORMAL LOW (ref >60)
GFR calc Af Amer: 33 mL/min — ABNORMAL LOW (ref >60)
GFR calc non Af Amer: 26 mL/min — ABNORMAL LOW (ref >60)
GFR calc non Af Amer: 28 mL/min — ABNORMAL LOW (ref >60)
Glucose, Bld: 123 mg/dL — ABNORMAL HIGH (ref 70–99)
Glucose, Bld: 116 mg/dL — ABNORMAL HIGH (ref 70–99)
Potassium: 3.8 mmol/L (ref 3.5–5.1)
Potassium: 3.9 mmol/L (ref 3.5–5.1)
Sodium: 137 mmol/L (ref 135–145)
Sodium: 140 mmol/L (ref 135–145)

## 2019-09-26 LAB — URINALYSIS, COMPLETE (UACMP) WITH MICROSCOPIC
Bilirubin Urine: NEGATIVE
Glucose, UA: NEGATIVE mg/dL
Hgb urine dipstick: NEGATIVE
Ketones, ur: NEGATIVE mg/dL
Leukocytes,Ua: NEGATIVE
Nitrite: NEGATIVE
Protein, ur: 100 mg/dL — AB
Specific Gravity, Urine: 1.006 (ref 1.005–1.030)
pH: 6 (ref 5.0–8.0)

## 2019-09-26 LAB — CBC
HCT: 29.1 % — ABNORMAL LOW (ref 36.0–46.0)
HCT: 28 % — ABNORMAL LOW (ref 36.0–46.0)
Hemoglobin: 9 g/dL — ABNORMAL LOW (ref 12.0–15.0)
Hemoglobin: 9.2 g/dL — ABNORMAL LOW (ref 12.0–15.0)
MCH: 26.3 pg (ref 26.0–34.0)
MCH: 26.7 pg (ref 26.0–34.0)
MCHC: 30.9 g/dL (ref 30.0–36.0)
MCHC: 32.9 g/dL (ref 30.0–36.0)
MCV: 85.1 fL (ref 80.0–100.0)
MCV: 81.4 fL (ref 80.0–100.0)
Platelets: 377 10*3/uL (ref 150–400)
Platelets: 366 10*3/uL (ref 150–400)
RBC: 3.42 MIL/uL — ABNORMAL LOW (ref 3.87–5.11)
RBC: 3.44 MIL/uL — ABNORMAL LOW (ref 3.87–5.11)
RDW: 14.6 % (ref 11.5–15.5)
RDW: 14.8 % (ref 11.5–15.5)
WBC: 10.3 10*3/uL (ref 4.0–10.5)
WBC: 10.4 10*3/uL (ref 4.0–10.5)
nRBC: 0 % (ref 0.0–0.2)
nRBC: 0 % (ref 0.0–0.2)

## 2019-09-26 MED ORDER — SODIUM CHLORIDE 0.9 % IV BOLUS
1000.0000 mL | Freq: Once | INTRAVENOUS | Status: AC
  Administered 2019-09-26: 1000 mL via INTRAVENOUS

## 2019-09-26 MED ORDER — SODIUM CHLORIDE 0.9 % IV BOLUS
500.0000 mL | Freq: Once | INTRAVENOUS | Status: AC
  Administered 2019-09-26: 500 mL via INTRAVENOUS

## 2019-09-26 MED ORDER — TRAMADOL HCL 50 MG PO TABS
50.0000 mg | ORAL_TABLET | Freq: Once | ORAL | Status: AC
  Administered 2019-09-26: 50 mg via ORAL
  Filled 2019-09-26: qty 1

## 2019-09-26 NOTE — ED Notes (Signed)
Pt c/o of headache.  

## 2019-09-26 NOTE — ED Notes (Signed)
Family at bedside. 

## 2019-09-26 NOTE — ED Provider Notes (Signed)
[facility] Emergency Department Provider Note ____________________________________________   First MD Initiated Contact with Patient 09/26/19 1310     (approximate)  I have reviewed the triage vital signs and the nursing notes.   HISTORY  Chief Complaint Near Syncope    HPI Brittany Acevedo is a 78 y.o. female here for evaluation of feeling very lightheaded  Patient reports that she has had lightheadedness for some time now, but last night she started feeling bit more lightheaded when she is up and about the normal.  Then today she was hungry, went to the kitchen was preparing herself some to eat when she got very lightheaded felt like she was going to pass out.  She reports she is passed out about once a year ago and the symptoms were the same, she had to lay on the floor and she did not pass out this time.  Family came to assist her and called 911  She was initially found to be somewhat hypotensive and slightly bradycardic with EMS.  No heart block though.  She reports she is feeling better now, is hungry and her son came and reports that they are having a hard time getting her to get enough nutrition.  She eats infrequently, patient reports that she only eats occasionally but does will keep up with Ensure shakes  She has had no headache no chest pain no trouble breathing no cough fever or cold.  No trouble urinating and she usually gets up once or twice a night to urinate which is not unusual   Past Medical History:  Diagnosis Date  . Anemia   . Asthma   . Broken ankle   . Emphysema lung (HCC)   . Fibromyalgia   . GERD (gastroesophageal reflux disease)   . Heart attack (HCC) 2013  . Hemorrhoids   . Hypertension   . Peripheral nervous system disorder   . Shortness of breath dyspnea   . Sinus trouble   . Wears dentures    full upper    Patient Active Problem List   Diagnosis Date Noted  . Hypertension 03/26/2019  . Personal history of tobacco  use, presenting hazards to health 10/15/2016  . Chronic pain 09/26/2016  . Hyperglycemia 05/09/2015  . Pelvic pain in female 05/09/2015  . Constipation 05/09/2015  . Anxiety 04/22/2015  . Autoimmune disease (HCC) 04/22/2015  . Body mass index (BMI) of 28.0-28.9 in adult 04/22/2015  . Atherosclerosis of coronary artery 04/22/2015  . Chronic headache 04/22/2015  . Dementia (HCC) 04/22/2015  . Clinical depression 04/22/2015  . Dizziness 04/22/2015  . Family history of diabetes mellitus 04/22/2015  . Broken rib 04/22/2015  . Acid reflux 04/22/2015  . Cannot sleep 04/22/2015  . Amnesia 04/22/2015  . Cramp in muscle 04/22/2015  . Excessive urination at night 04/22/2015  . Decreased potassium in the blood 04/22/2015  . Excessive falling 04/22/2015  . Pain in rib 04/22/2015  . History of biliary T-tube placement 04/22/2015  . Iron deficiency anemia 06/06/2014  . Anemia 05/02/2014  . Dyspnea 04/11/2014  . Tobacco abuse 04/11/2014  . 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 Fibrositis 03/16/2014  . Heart attack (HCC) 03/16/2014  . Hypercholesteremia 04/15/2006    Past Surgical History:  Procedure Laterality Date  . ABDOMINAL HYSTERECTOMY     w/ removal of ovarian cyst  . ANGIOPLASTY  2013  . APPENDECTOMY    . cyst removed from face  2008  . PARTIAL HYSTERECTOMY  1980  . TUBAL LIGATION  1970    Prior to Admission medications   Medication Sig Start Date End Date Taking? Authorizing Provider  amLODipine (NORVASC) 5 MG tablet Take 1 tablet (5 mg total) by mouth daily. 04/28/19  Yes Bacigalupo, Marzella SchleinAngela M, MD  atorvastatin (LIPITOR) 10 MG tablet Take 1 tablet (10 mg total) by mouth daily. 07/20/19  Yes Maple HudsonGilbert, Richard L Jr., MD  buPROPion [facility](WELLBUTRIN SR) 150 MG 12 hr tablet Take 1 tablet (150 mg total) by mouth 2 (two) times daily. Patient taking differently: Take 150 mg by mouth 2 (two) times daily.  08/17/19  Yes Maple HudsonGilbert, Richard L Jr., MD  clonazePAM  (KLONOPIN) 0.5 MG tablet Take 1 tablet (0.5 mg total) by mouth 2 (two) times daily. 09/14/19  Yes Maple HudsonGilbert, Richard L Jr., MD  clopidogrel (PLAVIX) 75 MG tablet 1 Tablet, Oral, daily, by mouth Patient taking differently: Take 75 mg by mouth daily.  12/02/18  Yes Maple HudsonGilbert, Richard L Jr., MD  donepezil (ARICEPT) 10 MG tablet Take 1 tablet (10 mg total) by mouth at bedtime. 06/23/19  Yes Maple HudsonGilbert, Richard L Jr., MD  glucose blood test strip Use as instructed 02/03/17  Yes Maple HudsonGilbert, Richard L Jr., MD  isosorbide mononitrate (IMDUR) 30 MG 24 hr tablet Take 1 tablet (30 mg total) by mouth daily. Patient taking differently: Take 30 mg by mouth daily.  11/02/18  Yes Maple HudsonGilbert, Richard L Jr., MD  metoprolol tartrate (LOPRESSOR) 25 MG tablet Take 0.5 tablets (12.5 mg total) by mouth 2 (two) times daily. Patient taking differently: Take 12.5 mg by mouth 2 (two) times daily.  07/20/19  Yes Maple HudsonGilbert, Richard L Jr., MD  [facility] DELICA LANCETS 33G MISC Use as instructed 04/17/18  Yes Maple HudsonGilbert, Richard L Jr., MD  pantoprazole (PROTONIX) 40 MG tablet Take 1 tablet (40 mg total) by mouth daily. Patient taking differently: Take 40 mg by mouth daily.  04/22/19  Yes Maple HudsonGilbert, Richard L Jr., MD  traMADol (ULTRAM) 50 MG tablet TAKE (1) TABLET EVERY SIX HOURS AS NEEDED. Patient taking differently: Take 50 mg by mouth every 6 (six) hours as needed for moderate pain.  09/14/19  Yes Maple HudsonGilbert, Richard L Jr., MD  albuterol (PROVENTIL HFA;VENTOLIN HFA) 108 [phone](90 Base) MCG/ACT inhaler Inhale 2 puffs into the lungs every 6 (six) hours as needed for wheezing or shortness of breath. 09/03/18   Maple HudsonGilbert, Richard L Jr., MD  aspirin 325 MG tablet Take 325 mg by mouth daily.    [provider]  calcium-vitamin D (OSCAL) 250-125 MG-UNIT per tablet Take 1 tablet by mouth daily.    [provider]  ferrous sulfate 325 (65 FE) MG tablet Take 1 tablet (325 mg total) by mouth 2 (two) times daily with a meal. Patient taking differently: Take 325 mg  by mouth daily with breakfast.  06/06/16   Maple HudsonGilbert, Richard L Jr., MD  fluticasone [facility](FLONASE) 50 MCG/ACT nasal spray Place into the nose as needed.  06/08/14   [provider]  nitroGLYCERIN (NITROSTAT) 0.4 MG SL tablet Place 1 tablet (0.4 mg total) under the tongue every 5 (five) minutes as needed for chest pain. 01/07/18   Paraschos, Alexander, MD  Omega-3 Fatty Acids (FISH OIL) 1000 MG CAPS Take by mouth daily.     [provider]    Allergies Gabapentin, Darvon [propoxyphene], and Shellfish allergy  Family History  Problem Relation Age of Onset  . Asthma Sister   . Diabetes Sister   . Heart disease Father   .  Rheum arthritis Father   . Clotting disorder Sister   . Rheum arthritis Brother   . Rheum arthritis Sister   . Emphysema Cousin   . Asthma Other   . Heart disease Maternal Uncle   . Heart disease Maternal Grandmother   . Rheum arthritis Son   . Cancer Other     Social History Social History   Tobacco Use  . Smoking status: Current Every Day Smoker    Packs/day: 1.50    Years: 55.00    Pack years: 82.50    Types: Cigarettes  . Smokeless tobacco: Never Used  Substance Use Topics  . Alcohol use: No  . Drug use: No    Review of Systems Constitutional: No fever/chills Eyes: No visual changes. ENT: No sore throat.  Does feel bit thirsty  cardiovascular: Denies chest pain. Respiratory: Denies shortness of breath. Gastrointestinal: No abdominal pain.   Genitourinary: Negative for dysuria. Musculoskeletal: Negative for back pain. Skin: Negative for rash. Neurological: Negative for headaches, areas of focal weakness or numbness.    ____________________________________________   PHYSICAL EXAM:  VITAL SIGNS: ED Triage Vitals  Enc Vitals Group     BP 09/26/19 1306 (!) 181/105     Pulse Rate 09/26/19 1306 (!) 58     Resp 09/26/19 1306 20     Temp 09/26/19 1306 97.8 F (36.6 C)     Temp Source 09/26/19 1306 Oral     SpO2 09/26/19 1306 100 %      Weight 09/26/19 1303 111 lb 4.8 oz (50.5 kg)     Height 09/26/19 1303 5' (1.524 m)     Head Circumference --      Peak Flow --      Pain Score 09/26/19 1302 0     Pain Loc --      Pain Edu? --      Excl. in GC? --     Constitutional: Alert and oriented. Well appearing and in no acute distress. Eyes: Conjunctivae are normal.  Extraocular movements are normal. Head: Atraumatic. Nose: No congestion/rhinnorhea. Mouth/Throat: Mucous membranes are slightly dry. Neck: No stridor.  Cardiovascular: Normal rate, regular rhythm. Grossly normal heart sounds.  Good peripheral circulation. Respiratory: Normal respiratory effort.  No retractions. Lungs CTAB. Gastrointestinal: Soft and nontender. No distention. Musculoskeletal: No lower extremity tenderness nor edema.  No pronator drift.  Moves all extremities well to command.  No sensory deficits. Neurologic:  Normal speech and language. No gross focal neurologic deficits are appreciated.  No facial droop or weakness. Skin:  Skin is warm, dry and intact. No rash noted. Psychiatric: Mood and affect are normal. Speech and behavior are normal.  ____________________________________________   LABS (all labs ordered are listed, but only abnormal results are displayed)  Labs Reviewed  BASIC METABOLIC PANEL - Abnormal; Notable for the following components:      Result Value   Glucose, Bld 123 (*)    BUN 25 (*)    Creatinine, Ser 1.84 (*)    Calcium 8.6 (*)    GFR calc non Af Amer 26 (*)    GFR calc Af Amer 30 (*)    All other components within normal limits  CBC - Abnormal; Notable for the following components:   RBC 3.42 (*)    Hemoglobin 9.0 (*)    HCT 29.1 (*)    All other components within normal limits  URINALYSIS, COMPLETE (UACMP) WITH MICROSCOPIC  CBG MONITORING, ED   ____________________________________________  EKG  Reviewed entered by me  at 1305 Heart rate 69 QRS 149 QTc 500 Normal sinus rhythm, right bundle branch block.   No evidence of acute ischemia denoted.  There is mild slurring of T waves in lead III and aVF. ____________________________________________  RADIOLOGY   ____________________________________________   PROCEDURES  Procedure(s) performed: None  Procedures  Critical Care performed: No  ____________________________________________   INITIAL IMPRESSION / ASSESSMENT AND PLAN / ED COURSE  Pertinent labs & imaging results that were available during my care of the patient were reviewed by me and considered in my medical decision making (see chart for details).   Patient is for evaluation of lightheadedness.  Reports she has been lightheaded on a regular basis for several weeks now, but today seemed a little bit more so in while she is standing in the kitchen she became more lightheaded had to lay down on the ground.  There is no fall or injury.  She called family over who called EMS.  She reports she felt like she was going to pass out while standing in the kitchen preparing herself some to eat  Clinical history and examination and lab work seem to suggest likely dehydration she is not hypotensive but she does report generalized fatigue and weakness and very poor appetite, not eating well for quite some time which is thought possibly related to her dementia.  She has acute kidney injury, discussed with patient and family we will hydrate her aggressively here, and hopefully with some improvement should be able to follow-up closely with her physician.  Her creatinine is greater than 2 though her GFR is reduced.  However, given our current situation with coronavirus and its significant impact on the hospital system lean towards hydration here and close outpatient management.  Patient family understanding  Clinical Course as of Sep 25 1526  Sun Sep 26, 2019  1526 Patient resting comfortably, reports she is feeling well.  Fully awake and alert and requesting sandwich tray which we have provided    [MQ]  1527 Ongoing care and disposition signed Dr. Archie Balboa, plan reassessment after completion of fluid bolus and orthostatics.   [MQ]  [id number]   Also follow-up urinalysis.  If reassuring reassessment and after review of urinalysis patient continues to do well with discharge to follow-up closely with Dr. Rosanna Randy whom I have communicated with already   [MQ]    Clinical Course User Index [MQ] Delman Kitten, MD    ----------------------------------------- 3:10 PM on 09/26/2019 ----------------------------------------- Case reviewed with patient's primary Dr. Rosanna Randy.  He reports that his clinic will follow up with her and see that she is able to get close follow-up this week for reassessment.  She is currently resting, requesting something to eat at this time which will provide.  Have ordered  ____________________________________________   FINAL CLINICAL IMPRESSION(S) / ED DIAGNOSES  Final diagnoses:  Near syncope  Dehydration        Note:  This document was prepared using Dragon voice recognition software and may include unintentional dictation errors       Delman Kitten, MD 09/26/19 1528

## 2019-09-26 NOTE — ED Notes (Signed)
Pt and son state they want to go home.

## 2019-09-26 NOTE — ED Notes (Signed)
Verbal order per Dr. Archie Balboa to repeat blood work since IVF are finished.

## 2019-09-26 NOTE — ED Notes (Signed)
ED Provider at bedside. 

## 2019-09-26 NOTE — ED Notes (Signed)
Pt assisted to toilet- reports mild dizziness upon first standing up that improves with movement. Pt in bed eating at this time.

## 2019-09-26 NOTE — ED Notes (Signed)
Patient is resting comfortably. 

## 2019-09-26 NOTE — ED Notes (Signed)
Pt ambulating with minimal assistance in room, denies dizziness. Pt assisted to toilet. Son states she is now ambulating at her baseline (usually requires assistance or cane to walk).

## 2019-09-26 NOTE — Discharge Instructions (Addendum)
Please follow-up with Dr. Alben Spittle office in the next couple of days.  As we discussed it is important to have your doctor recheck your kidney function and blood level. Dr. Jacqualine Code contacted him as well, he is happy to see you for close follow-up.

## 2019-09-26 NOTE — ED Triage Notes (Signed)
Pt arrives [facility] from home for near syncopal episode. Did not pass out. Was helped with family to sitting position. Was in kitchen and started to feel lightheaded.   HR 44 SB, 98/54 initially. EMS reports no heart block noted. Arrives with 18 L AC. CBG normal.   Received 528ml NS enroute. Pt arrives cold, warm blankets applied. Hx cardiac issues. Hx alzheimers per EMS.   Pt is A&O to this RN. Answering all questions correctly and appropriately.

## 2019-09-26 NOTE — ED Notes (Signed)
Pt adjusted in bed, provided ice chips for comfort.

## 2019-09-27 NOTE — Progress Notes (Signed)
Patient: Brittany Acevedo Female    DOB: 1941/02/04   78 y.o.   MRN: [id number] Visit Date: 09/28/2019  Today's Provider: Wilhemena Durie, MD   Chief Complaint  Patient presents with  . Follow-up   Subjective:     HPI    Follow up ER visit  Patient was seen in ER for Near Syncope on 08/27/2019. She was treated for Near Syncope. Treatment for this included labs and ekg. Patient was advised to hydrate and follow up with her pcp. She reports good compliance with treatment. She reports this condition is Improved.  ----------------------------------------------------------- Patient states she feels somewhat better since her ER visit. However she is still not feeling well.    Of note pt has had an unintentional weight loss of 42 lbs since January 2020. No systemic symptoms. I spent 25 minutes with pt and daughter in law discussing these issues. Evidently patient's husband passed away in Feb 23, 2018 and patient quit smoking about that time. Allergies  Allergen Reactions  . Gabapentin Anaphylaxis    Throat closes  . Darvon [Propoxyphene]     hives  . Shellfish Allergy     "causes problems with my fibromyalgia"     Current Outpatient Medications:  .  albuterol (PROVENTIL HFA;VENTOLIN HFA) 108 (90 Base) MCG/ACT inhaler, Inhale 2 puffs into the lungs every 6 (six) hours as needed for wheezing or shortness of breath., Disp: 18 g, Rfl: 3 .  amLODipine (NORVASC) 5 MG tablet, Take 1 tablet (5 mg total) by mouth daily., Disp: 30 tablet, Rfl: 5 .  aspirin 325 MG tablet, Take 325 mg by mouth daily., Disp: , Rfl:  .  atorvastatin (LIPITOR) 10 MG tablet, Take 1 tablet (10 mg total) by mouth daily., Disp: 90 tablet, Rfl: 0 .  buPROPion (WELLBUTRIN SR) 150 MG 12 hr tablet, Take 1 tablet (150 mg total) by mouth 2 (two) times daily. (Patient taking differently: Take 150 mg by mouth 2 (two) times daily. ), Disp: 60 tablet, Rfl: 1 .  calcium-vitamin D (OSCAL) 250-125 MG-UNIT per tablet, Take  1 tablet by mouth daily., Disp: , Rfl:  .  clonazePAM (KLONOPIN) 0.5 MG tablet, Take 1 tablet (0.5 mg total) by mouth 2 (two) times daily., Disp: 60 tablet, Rfl: 0 .  clopidogrel (PLAVIX) 75 MG tablet, 1 Tablet, Oral, daily, by mouth (Patient taking differently: Take 75 mg by mouth daily. ), Disp: 90 tablet, Rfl: 3 .  donepezil (ARICEPT) 10 MG tablet, Take 1 tablet (10 mg total) by mouth at bedtime., Disp: 30 tablet, Rfl: 11 .  ferrous sulfate 325 (65 FE) MG tablet, Take 1 tablet (325 mg total) by mouth 2 (two) times daily with a meal. (Patient taking differently: Take 325 mg by mouth daily with breakfast. ), Disp: 60 tablet, Rfl: 5 .  fluticasone (FLONASE) 50 MCG/ACT nasal spray, Place 1-2 sprays into the nose daily as needed for allergies. , Disp: , Rfl:  .  glucose blood test strip, Use as instructed, Disp: 100 each, Rfl: 12 .  isosorbide mononitrate (IMDUR) 30 MG 24 hr tablet, Take 1 tablet (30 mg total) by mouth daily. (Patient taking differently: Take 30 mg by mouth daily. ), Disp: 90 tablet, Rfl: 3 .  metoprolol tartrate (LOPRESSOR) 25 MG tablet, Take 0.5 tablets (12.5 mg total) by mouth 2 (two) times daily. (Patient taking differently: Take 12.5 mg by mouth 2 (two) times daily. ), Disp: 90 tablet, Rfl: 1 .  nitroGLYCERIN (NITROSTAT) 0.4 MG SL  tablet, Place 1 tablet (0.4 mg total) under the tongue every 5 (five) minutes as needed for chest pain., Disp: 25 tablet, Rfl: 11 .  Omega-3 Fatty Acids (FISH OIL) 1000 MG CAPS, Take 1,000 mg by mouth daily. , Disp: , Rfl:  .  ONETOUCH DELICA LANCETS 33G MISC, Use as instructed, Disp: 100 each, Rfl: 11 .  pantoprazole (PROTONIX) 40 MG tablet, Take 1 tablet (40 mg total) by mouth daily. (Patient taking differently: Take 40 mg by mouth daily. ), Disp: 90 tablet, Rfl: 0 .  traMADol (ULTRAM) 50 MG tablet, TAKE (1) TABLET EVERY SIX HOURS AS NEEDED. (Patient taking differently: Take 50 mg by mouth every 6 (six) hours as needed for moderate pain. ), Disp: 120  tablet, Rfl: 0  Review of Systems  Constitutional: Negative for appetite change, chills, fatigue and fever.  HENT: Negative.   Eyes: Negative.   Respiratory: Negative for chest tightness and shortness of breath.   Cardiovascular: Negative for chest pain and palpitations.  Gastrointestinal: Negative for abdominal pain, nausea and vomiting.  Endocrine: Negative.   Allergic/Immunologic: Negative.   Neurological: Positive for dizziness. Negative for weakness.       She says she has been dizzy for a long time.  Hematological: Negative.   Psychiatric/Behavioral: Negative.     Social History   Tobacco Use  . Smoking status: Current Every Day Smoker    Packs/day: 1.50    Years: 55.00    Pack years: 82.50    Types: Cigarettes  . Smokeless tobacco: Never Used  Substance Use Topics  . Alcohol use: No      Objective:   BP (!) 179/84 (BP Location: Left Arm, Patient Position: Sitting, Cuff Size: Normal)   Pulse 74   Temp (!) 97.5 F (36.4 C) (Other (Comment))   Resp 18   Ht 5' (1.524 m)   Wt 103 lb 12.8 oz (47.1 kg)   SpO2 98%   BMI 20.27 kg/m  Vitals:   09/28/19 1159  BP: (!) 179/84  Pulse: 74  Resp: 18  Temp: (!) 97.5 F (36.4 C)  TempSrc: Other (Comment)  SpO2: 98%  Weight: 103 lb 12.8 oz (47.1 kg)  Height: 5' (1.524 m)  Body mass index is 20.27 kg/m.   Physical Exam Vitals reviewed.  Constitutional:      General: She is not in acute distress.    Appearance: She is well-developed. She is not toxic-appearing.     Comments: Patient in wheelchair on exam today  HENT:     Head: Normocephalic and atraumatic.     Right Ear: External ear normal.     Left Ear: External ear normal.     Nose: Nose normal.  Eyes:     General: No scleral icterus.    Conjunctiva/sclera: Conjunctivae normal.  Neck:     Thyroid: No thyromegaly.  Cardiovascular:     Rate and Rhythm: Normal rate and regular rhythm.     Heart sounds: Normal heart sounds.  Pulmonary:     Effort:  Pulmonary effort is normal.  Abdominal:     Palpations: Abdomen is soft.  Skin:    General: Skin is warm and dry.  Neurological:     General: No focal deficit present.     Mental Status: She is alert and oriented to person, place, and time.  Psychiatric:        Mood and Affect: Mood normal.        Behavior: Behavior normal.  Thought Content: Thought content normal.        Judgment: Judgment normal.      No results found for any visits on 09/28/19.     Assessment & Plan    1. Near syncope Probably dehydration.  Encourage  Hydration.. Patient reports that she has felt "dizzy" for a long time. - CBC w/Diff/Platelet - Fe+TIBC+Fer - TSH - Comprehensive Metabolic Panel (CMET) - POCT urinalysis dipstick  2. Essential hypertension Increase amlodipine 5 mg to 10 mg daily.  - CBC w/Diff/Platelet - Fe+TIBC+Fer - TSH - Comprehensive Metabolic Panel (CMET)  - POCT urinalysis dipstick - amLODipine (NORVASC) 10 MG tablet; Take 1 tablet (10 mg total) by mouth daily.  Dispense: 30 tablet; Refill: 5  3. Hypercholesteremia On Lipitor - CBC w/Diff/Platelet - Fe+TIBC+Fer - TSH - Comprehensive Metabolic Panel (CMET) - POCT urinalysis dipstick  4. Iron deficiency anemia, unspecified iron deficiency anemia type Patient was supposed to have a GI work-up a couple years ago but was having cardiac issues at the time.  She never followed up on this. - CBC w/Diff/Platelet - Fe+TIBC+Fer - TSH - Comprehensive Metabolic Panel (CMET) - POCT urinalysis dipstick   5. 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  6. Anxiety Chronic issue, on clonazepam  7. Depression, unspecified depression type On duloxetine  8. Dementia with behavioral disturbance, unspecified dementia type (HCC) MMSE.  Is 30/30 today.  He might have mild cognitive impairment  but I do not think she has dementia.  9. Tobacco abuse Discontinue this year.  10. Weight loss, unintentional 42 pounds in 2020.  This is certainly worrisome for underlying malignancy.  Discussed this with patient and daughter in law  at length.  They will discuss with other family and we will plan approach will follow up after labs are done.More than 50% 45 minute visit spent in counseling or coordination of care     Follow up in 2 weeks.    I,Lashonda Sonneborn,acting as a scribe for Megan Mans, MD.,have documented all relevant documentation on the behalf of Megan Mans, MD,as directed by  Megan Mans, MD while in the presence of Megan Mans, MD.      Megan Mans, MD  [facility]

## 2019-09-28 ENCOUNTER — Encounter: Payer: Self-pay | Admitting: Family Medicine

## 2019-09-28 ENCOUNTER — Ambulatory Visit (INDEPENDENT_AMBULATORY_CARE_PROVIDER_SITE_OTHER): Payer: Medicare Other | Admitting: Family Medicine

## 2019-09-28 ENCOUNTER — Other Ambulatory Visit: Payer: Self-pay

## 2019-09-28 VITALS — BP 186/90 | HR 74 | Temp 97.5°F | Resp 18 | Ht 60.0 in | Wt 103.8 lb

## 2019-09-28 DIAGNOSIS — I25708 Atherosclerosis of coronary artery bypass graft(s), unspecified, with other forms of angina pectoris: Secondary | ICD-10-CM | POA: Diagnosis not present

## 2019-09-28 DIAGNOSIS — R55 Syncope and collapse: Secondary | ICD-10-CM | POA: Diagnosis not present

## 2019-09-28 DIAGNOSIS — I10 Essential (primary) hypertension: Secondary | ICD-10-CM | POA: Diagnosis not present

## 2019-09-28 DIAGNOSIS — E7800 Pure hypercholesterolemia, unspecified: Secondary | ICD-10-CM

## 2019-09-28 DIAGNOSIS — Z720 Tobacco use: Secondary | ICD-10-CM

## 2019-09-28 DIAGNOSIS — F329 Major depressive disorder, single episode, unspecified: Secondary | ICD-10-CM

## 2019-09-28 DIAGNOSIS — F419 Anxiety disorder, unspecified: Secondary | ICD-10-CM

## 2019-09-28 DIAGNOSIS — D509 Iron deficiency anemia, unspecified: Secondary | ICD-10-CM | POA: Diagnosis not present

## 2019-09-28 DIAGNOSIS — R634 Abnormal weight loss: Secondary | ICD-10-CM

## 2019-09-28 DIAGNOSIS — F32A Depression, unspecified: Secondary | ICD-10-CM

## 2019-09-28 DIAGNOSIS — F0391 Unspecified dementia with behavioral disturbance: Secondary | ICD-10-CM

## 2019-09-28 LAB — POCT URINALYSIS DIPSTICK
Appearance: NORMAL
Bilirubin, UA: NEGATIVE
Glucose, UA: NEGATIVE
Ketones, UA: NEGATIVE
Leukocytes, UA: NEGATIVE
Nitrite, UA: NEGATIVE
Odor: NORMAL
Protein, UA: POSITIVE — AB
Spec Grav, UA: 1.02 (ref 1.010–1.025)
Urobilinogen, UA: 0.2 E.U./dL
pH, UA: 6 (ref 5.0–8.0)

## 2019-09-28 MED ORDER — AMLODIPINE BESYLATE 10 MG PO TABS: 10.0000 mg | ORAL_TABLET | Freq: Every day | ORAL | 5 refills | Status: DC

## 2019-09-28 NOTE — Patient Instructions (Signed)
Stop amlodipine 5 mg and start  amlodipine 10 mg daily. Follow up in 2 weeks.  Bring your medications to follow up in their bottles.

## 2019-09-29 LAB — COMPREHENSIVE METABOLIC PANEL
ALT: 16 IU/L (ref 0–32)
AST: 23 IU/L (ref 0–40)
Albumin/Globulin Ratio: 1.2 (ref 1.2–2.2)
Albumin: 3.2 g/dL — ABNORMAL LOW (ref 3.7–4.7)
Alkaline Phosphatase: 65 IU/L (ref 39–117)
BUN/Creatinine Ratio: 13 (ref 12–28)
BUN: 19 mg/dL (ref 8–27)
Bilirubin Total: <0.2 mg/dL (ref 0.0–1.2)
CO2: 22 mmol/L (ref 20–29)
Calcium: 8.8 mg/dL (ref 8.7–10.3)
Chloride: 102 mmol/L (ref 96–106)
Creatinine, Ser: 1.47 mg/dL — ABNORMAL HIGH (ref 0.57–1.00)
GFR calc Af Amer: 39 mL/min/{1.73_m2} — ABNORMAL LOW (ref >59)
GFR calc non Af Amer: 34 mL/min/{1.73_m2} — ABNORMAL LOW (ref >59)
Globulin, Total: 2.7 g/dL (ref 1.5–4.5)
Glucose: 83 mg/dL (ref 65–99)
Potassium: 3.7 mmol/L (ref 3.5–5.2)
Sodium: 138 mmol/L (ref 134–144)
Total Protein: 5.9 g/dL — ABNORMAL LOW (ref 6.0–8.5)

## 2019-09-29 LAB — CBC WITH DIFFERENTIAL/PLATELET
Basophils Absolute: 0.1 10*3/uL (ref 0.0–0.2)
Basos: 1 %
EOS (ABSOLUTE): 0.3 10*3/uL (ref 0.0–0.4)
Eos: 3 %
Hematocrit: 30.4 % — ABNORMAL LOW (ref 34.0–46.6)
Hemoglobin: 9.4 g/dL — ABNORMAL LOW (ref 11.1–15.9)
Immature Grans (Abs): 0 10*3/uL (ref 0.0–0.1)
Immature Granulocytes: 0 %
Lymphocytes Absolute: 1.7 10*3/uL (ref 0.7–3.1)
Lymphs: 16 %
MCH: 26.5 pg — ABNORMAL LOW (ref 26.6–33.0)
MCHC: 30.9 g/dL — ABNORMAL LOW (ref 31.5–35.7)
MCV: 86 fL (ref 79–97)
Monocytes Absolute: 0.8 10*3/uL (ref 0.1–0.9)
Monocytes: 7 %
Neutrophils Absolute: 7.7 10*3/uL — ABNORMAL HIGH (ref 1.4–7.0)
Neutrophils: 73 %
Platelets: 384 10*3/uL (ref 150–450)
RBC: 3.55 x10E6/uL — ABNORMAL LOW (ref 3.77–5.28)
RDW: 13.7 % (ref 11.7–15.4)
WBC: 10.5 10*3/uL (ref 3.4–10.8)

## 2019-09-29 LAB — IRON,TIBC AND FERRITIN PANEL
Ferritin: 51 ng/mL (ref 15–150)
Iron Saturation: 12 % — ABNORMAL LOW (ref 15–55)
Iron: 32 ug/dL (ref 27–139)
Total Iron Binding Capacity: 275 ug/dL (ref 250–450)
UIBC: 243 ug/dL (ref 118–369)

## 2019-09-29 LAB — TSH: TSH: 8.15 u[IU]/mL — ABNORMAL HIGH (ref 0.450–4.500)

## 2019-10-11 ENCOUNTER — Other Ambulatory Visit: Payer: Self-pay | Admitting: Family Medicine

## 2019-10-11 DIAGNOSIS — F419 Anxiety disorder, unspecified: Secondary | ICD-10-CM

## 2019-10-11 NOTE — Telephone Encounter (Signed)
Requested medication (s) are due for refill today:yes  Requested medication (s) are on the active medication list: yes  Last refill: 09/14/2019  Future visit scheduled: yes  Notes to clinic:  This refill cannot be delegated   Requested Prescriptions  Pending Prescriptions Disp Refills   clonazePAM (KLONOPIN) 0.5 MG tablet [Pharmacy Med Name: CLONAZEPAM 0.5 MG TABLET] 60 tablet 0    Sig: Take 1 tablet (0.5 mg total) by mouth 2 (two) times daily.      Not Delegated - Psychiatry:  Anxiolytics/Hypnotics Failed - 10/11/2019  3:16 PM      Failed - This refill cannot be delegated      Failed - Urine Drug Screen completed in last 360 days.      Passed - Valid encounter within last 6 months    Recent Outpatient Visits           1 week ago Near syncope   [facility] Jerrol Banana., MD   6 months ago Essential hypertension   [place], Dionne Bucy, MD   7 months ago Accelerated hypertension   [facility], Dionne Bucy, MD   11 months ago [place] Jerrol Banana., MD   1 year ago Encounter for annual physical examination excluding gynecological examination in a patient older than 17 years   [facility] Jerrol Banana., MD       Future Appointments             In 2 weeks Jerrol Banana., MD [facility]              traMADol (ULTRAM) 50 MG tablet [Pharmacy Med Name: TRAMADOL HCL 50 MG TABLET] 120 tablet 0    Sig: TAKE (1) TABLET EVERY SIX HOURS AS NEEDED.      Not Delegated - Analgesics:  Opioid Agonists Failed - 10/11/2019  3:16 PM      Failed - This refill cannot be delegated      Failed - Urine Drug Screen completed in last 360 days.      Passed - Valid encounter within last 6 months    Recent Outpatient Visits           1 week ago Near syncope   [facility] Jerrol Banana., MD   6 months  ago Essential hypertension   [place], Dionne Bucy, MD   7 months ago Accelerated hypertension   [place], Dionne Bucy, MD   11 months ago [place] Jerrol Banana., MD   1 year ago Encounter for annual physical examination excluding gynecological examination in a patient older than 17 years   [facility] Jerrol Banana., MD       Future Appointments             In 2 weeks Jerrol Banana., MD [facility], [place]

## 2019-10-12 ENCOUNTER — Ambulatory Visit: Payer: Self-pay | Admitting: Family Medicine

## 2019-10-13 ENCOUNTER — Other Ambulatory Visit: Payer: Self-pay | Admitting: Family Medicine

## 2019-10-13 DIAGNOSIS — J45909 Unspecified asthma, uncomplicated: Secondary | ICD-10-CM

## 2019-10-14 ENCOUNTER — Other Ambulatory Visit: Payer: Self-pay | Admitting: Family Medicine

## 2019-10-18 ENCOUNTER — Other Ambulatory Visit: Payer: Self-pay | Admitting: Family Medicine

## 2019-10-19 ENCOUNTER — Other Ambulatory Visit: Payer: Self-pay | Admitting: Family Medicine

## 2019-10-26 ENCOUNTER — Ambulatory Visit: Payer: Medicare Other | Admitting: Family Medicine

## 2019-11-03 ENCOUNTER — Other Ambulatory Visit: Payer: Self-pay | Admitting: Family Medicine

## 2019-11-03 NOTE — Telephone Encounter (Signed)
Requested medication (s) are due for refill today -yes  Requested medication (s) are on the active medication list -yes  Future visit scheduled -yes  Last refill: 10/12/19  Notes to clinic: Patient is requesting refill of non delegated Rx - sent for PCP review   Requested Prescriptions  Pending Prescriptions Disp Refills   traMADol (ULTRAM) 50 MG tablet [Pharmacy Med Name: TRAMADOL HCL 50 MG TABLET] 100 tablet 0    Sig: TAKE (1) TABLET EVERY SIX HOURS AS NEEDED.      Not Delegated - Analgesics:  Opioid Agonists Failed - 11/03/2019 12:59 PM      Failed - This refill cannot be delegated      Failed - Urine Drug Screen completed in last 360 days.      Passed - Valid encounter within last 6 months    Recent Outpatient Visits           1 month ago Near syncope   [facility] Maple Hudson., MD   7 months ago Essential hypertension   [facility] [place], Marzella Schlein, MD   7 months ago Accelerated hypertension   [facility], Marzella Schlein, MD   11 months ago Cystitis   [facility] Maple Hudson., MD   1 year ago Encounter for annual physical examination excluding gynecological examination in a patient older than 17 years   [facility] Maple Hudson., MD       Future Appointments             In 1 week Maple Hudson., MD [facility]                Requested Prescriptions  Pending Prescriptions Disp Refills   traMADol (ULTRAM) 50 MG tablet [Pharmacy Med Name: TRAMADOL HCL 50 MG TABLET] 100 tablet 0    Sig: TAKE (1) TABLET EVERY SIX HOURS AS NEEDED.      Not Delegated - Analgesics:  Opioid Agonists Failed - 11/03/2019 12:59 PM      Failed - This refill cannot be delegated      Failed - Urine Drug Screen completed in last 360 days.      Passed - Valid encounter within last 6 months    Recent Outpatient Visits           1 month ago Near  syncope   [facility] Maple Hudson., MD   7 months ago Essential hypertension   [facility] [place], Marzella Schlein, MD   7 months ago Accelerated hypertension   [facility] [place], Marzella Schlein, MD   11 months ago Cystitis   [facility] Maple Hudson., MD   1 year ago Encounter for annual physical examination excluding gynecological examination in a patient older than 17 years   [facility] Maple Hudson., MD       Future Appointments             In 1 week Maple Hudson., MD [facility]

## 2019-11-04 ENCOUNTER — Other Ambulatory Visit: Payer: Self-pay | Admitting: Family Medicine

## 2019-11-04 ENCOUNTER — Telehealth: Payer: Self-pay | Admitting: Family Medicine

## 2019-11-04 DIAGNOSIS — J45909 Unspecified asthma, uncomplicated: Secondary | ICD-10-CM

## 2019-11-04 NOTE — Telephone Encounter (Signed)
Called placed to patient for request of [employer]. Medication last filled 10/13/2019. Need to assure stable condition.

## 2019-11-04 NOTE — Telephone Encounter (Signed)
Requested medication (s) are due for refill today: yes  Requested medication (s) are on the active medication list: yes  Last refill:  10/13/2019  Future visit scheduled: yes  Notes to clinic:  Attempted to contact patient.  Last refill < 30 days. No answer. Left VM to return call to office.    Requested Prescriptions  Pending Prescriptions Disp Refills   PROAIR HFA 108 (90 Base) MCG/ACT inhaler [Pharmacy Med Name: PROAIR HFA 90 MCG INHALER] 8.5 g 0    Sig: Inhale 2 puffs into the lungs every 6 (six) hours as needed for wheezing or shortness of breath.      Pulmonology:  Beta Agonists Failed - 11/04/2019 11:35 AM      Failed - One inhaler should last at least one month. If the patient is requesting refills earlier, contact the patient to check for uncontrolled symptoms.      Passed - Valid encounter within last 12 months    Recent Outpatient Visits           1 month ago Near syncope   [facility] Maple Hudson., MD   7 months ago Essential hypertension   [facility] [place], Marzella Schlein, MD   7 months ago Accelerated hypertension   [facility] [place], Marzella Schlein, MD   11 months ago Cystitis   [facility] Maple Hudson., MD   1 year ago Encounter for annual physical examination excluding gynecological examination in a patient older than 17 years   [facility] Maple Hudson., MD       Future Appointments             In 1 week Maple Hudson., MD [facility]

## 2019-11-15 ENCOUNTER — Ambulatory Visit (INDEPENDENT_AMBULATORY_CARE_PROVIDER_SITE_OTHER): Payer: Medicare Other | Admitting: Family Medicine

## 2019-11-15 DIAGNOSIS — R634 Abnormal weight loss: Secondary | ICD-10-CM

## 2019-11-15 DIAGNOSIS — I10 Essential (primary) hypertension: Secondary | ICD-10-CM | POA: Diagnosis not present

## 2019-11-15 NOTE — Progress Notes (Signed)
Patient: Brittany Acevedo Female    DOB: 09-07-1941   79 y.o.   MRN: [id number] Visit Date: 11/15/2019  Today's Provider: Wilhemena Durie, MD   Chief Complaint  Patient presents with  . Hypertension  . Weight Loss   Subjective:    Virtual Visit via Telephone Note  I connected with Brittany Acevedo on 11/15/19 at  3:20 PM EST by telephone and verified that I am speaking with the correct person using two identifiers.  Location: Patient: home Provider: office   I discussed the limitations, risks, security and privacy concerns of performing an evaluation and management service by telephone and the availability of in person appointments. I also discussed with the patient that there may be a patient responsible charge related to this service. The patient expressed understanding and agreed to proceed.  HPI   Patient's daughter in law thinks that the Wellbutrin is what is affecting the patient's appetite. Would also like to discuss an alternative toTramadol medication.    Allergies  Allergen Reactions  . Gabapentin Anaphylaxis    Throat closes  . Darvon [Propoxyphene]     hives  . Shellfish Allergy     "causes problems with my fibromyalgia"     Current Outpatient Medications:  .  clonazePAM (KLONOPIN) 0.5 MG tablet, Take 1 tablet (0.5 mg total) by mouth 2 (two) times daily., Disp: 60 tablet, Rfl: 0 .  traMADol (ULTRAM) 50 MG tablet, TAKE (1) TABLET EVERY SIX HOURS AS NEEDED., Disp: 100 tablet, Rfl: 0 .  amLODipine (NORVASC) 10 MG tablet, Take 1 tablet (10 mg total) by mouth daily., Disp: 30 tablet, Rfl: 5 .  aspirin 325 MG tablet, Take 325 mg by mouth daily., Disp: , Rfl:  .  atorvastatin (LIPITOR) 10 MG tablet, Take 1 tablet (10 mg total) by mouth daily., Disp: 90 tablet, Rfl: 0 .  buPROPion (WELLBUTRIN SR) 150 MG 12 hr tablet, Take 1 tablet (150 mg total) by mouth 2 (two) times daily. (Patient taking differently: Take 150 mg by mouth 2 (two) times daily. ), Disp: 60  tablet, Rfl: 1 .  calcium-vitamin D (OSCAL) 250-125 MG-UNIT per tablet, Take 1 tablet by mouth daily., Disp: , Rfl:  .  clopidogrel (PLAVIX) 75 MG tablet, 1 Tablet, Oral, daily, by mouth (Patient taking differently: Take 75 mg by mouth daily. ), Disp: 90 tablet, Rfl: 3 .  donepezil (ARICEPT) 10 MG tablet, Take 1 tablet (10 mg total) by mouth at bedtime., Disp: 30 tablet, Rfl: 11 .  ferrous sulfate 325 (65 FE) MG tablet, Take 1 tablet (325 mg total) by mouth 2 (two) times daily with a meal. (Patient taking differently: Take 325 mg by mouth daily with breakfast. ), Disp: 60 tablet, Rfl: 5 .  fluticasone (FLONASE) 50 MCG/ACT nasal spray, Place 1-2 sprays into the nose daily as needed for allergies. , Disp: , Rfl:  .  glucose blood test strip, Use as instructed, Disp: 100 each, Rfl: 12 .  isosorbide mononitrate (IMDUR) 30 MG 24 hr tablet, Take 1 tablet (30 mg total) by mouth daily. (Patient taking differently: Take 30 mg by mouth daily. ), Disp: 90 tablet, Rfl: 3 .  metoprolol tartrate (LOPRESSOR) 25 MG tablet, Take 0.5 tablets (12.5 mg total) by mouth 2 (two) times daily. (Patient taking differently: Take 12.5 mg by mouth 2 (two) times daily. ), Disp: 90 tablet, Rfl: 1 .  nitroGLYCERIN (NITROSTAT) 0.4 MG SL tablet, Place 1 tablet (0.4 mg total) under the  tongue every 5 (five) minutes as needed for chest pain., Disp: 25 tablet, Rfl: 11 .  Omega-3 Fatty Acids (FISH OIL) 1000 MG CAPS, Take 1,000 mg by mouth daily. , Disp: , Rfl:  .  ONETOUCH DELICA LANCETS 33G MISC, Use as instructed, Disp: 100 each, Rfl: 11 .  pantoprazole (PROTONIX) 40 MG tablet, Take 1 tablet (40 mg total) by mouth daily., Disp: 90 tablet, Rfl: 0 .  PROAIR HFA 108 (90 Base) MCG/ACT inhaler, Inhale 2 puffs into the lungs every 6 (six) hours as needed for wheezing or shortness of breath., Disp: 18 g, Rfl: 2  Review of Systems  Social History   Tobacco Use  . Smoking status: Current Every Day Smoker    Packs/day: 1.50    Years: 55.00     Pack years: 82.50    Types: Cigarettes  . Smokeless tobacco: Never Used  Substance Use Topics  . Alcohol use: No      Objective:   There were no vitals taken for this visit. There were no vitals filed for this visit.There is no height or weight on file to calculate BMI.   Physical Exam   No results found for any visits on 11/15/19.     Assessment & Plan     1. Essential hypertension   2. Weight loss, unintentional This needs to be an office visit, not a phone call.  Arrange so in the next couple of weeks.  Very concerned about the possibility of malignancy.  I discussed the assessment and treatment plan with the patient. The patient was provided an opportunity to ask questions and all were answered. The patient agreed with the plan and demonstrated an understanding of the instructions.   The patient was advised to call back or seek an in-person evaluation if the symptoms worsen or if the condition fails to improve as anticipated.  I provided 5 minutes of non-face-to-face time during this encounter.     Richard Wendelyn Breslow, MD  [facility]

## 2019-12-10 ENCOUNTER — Other Ambulatory Visit: Payer: Self-pay | Admitting: Family Medicine

## 2019-12-10 DIAGNOSIS — F32A Depression, unspecified: Secondary | ICD-10-CM

## 2019-12-10 DIAGNOSIS — F329 Major depressive disorder, single episode, unspecified: Secondary | ICD-10-CM

## 2019-12-10 NOTE — Telephone Encounter (Signed)
Refill request for tramadol 50 mg, controlled medication. And Wellbutrin 150 mg. Pt needs a PHQ -2 or PHQ-9

## 2019-12-16 ENCOUNTER — Other Ambulatory Visit: Payer: Self-pay | Admitting: Family Medicine

## 2019-12-16 MED ORDER — BUPROPION HCL ER (SR) 150 MG PO TB12: ORAL_TABLET | ORAL | 0 refills | Status: AC

## 2019-12-16 NOTE — Telephone Encounter (Signed)
Patient's son was advised.  

## 2019-12-16 NOTE — Telephone Encounter (Signed)
Pt stated she requested this refill last week and she would like to know what is going on. Pt requests call back

## 2019-12-16 NOTE — Telephone Encounter (Signed)
Please advise 

## 2019-12-16 NOTE — Telephone Encounter (Signed)
Requested medication (s) are due for refill today: yes  Requested medication (s) are on the active medication list: yes  Last refill:  11/08/19 #100  Future visit scheduled: yes  Notes to clinic: Refill not delegated. Pt also states that she requested refill last week.     Requested Prescriptions  Pending Prescriptions Disp Refills   traMADol (ULTRAM) 50 MG tablet [Pharmacy Med Name: TRAMADOL HCL 50 MG TABLET] 100 tablet 0    Sig: TAKE (1) TABLET EVERY SIX HOURS AS NEEDED.      Not Delegated - Analgesics:  Opioid Agonists Failed - 12/16/2019  5:24 PM      Failed - This refill cannot be delegated      Failed - Urine Drug Screen completed in last 360 days.      Passed - Valid encounter within last 6 months    Recent Outpatient Visits           1 month ago Essential hypertension   [facility] Maple Hudson., MD   2 months ago Near syncope   [facility] Maple Hudson., MD   8 months ago Essential hypertension   [facility] [place], Marzella Schlein, MD   9 months ago Accelerated hypertension   [facility], Marzella Schlein, MD   1 year ago Cystitis   [facility] Maple Hudson., MD       Future Appointments             In 3 weeks Maple Hudson., MD [facility]

## 2019-12-16 NOTE — Telephone Encounter (Signed)
She has lost significant weight and I said at the time which 1 think was late January and needed to see her back in about a month.  She can have 1 month of refills but then she needs to be seen by the end of this month.

## 2019-12-16 NOTE — Telephone Encounter (Addendum)
Patient daughter in law Mrs. Laural Benes states the following mother weighs 104lbs and PCP nurse needed to be made aware in order to fill medication mentioned below. Please advise

## 2019-12-16 NOTE — Addendum Note (Signed)
Addended by: Marlene Lard on: 12/16/2019 04:53 PM   Modules accepted: Orders

## 2019-12-17 NOTE — Telephone Encounter (Signed)
Pt's daughter in law called in to follow up on pt's med refill for Tramadol. She stated that pt will be taking her last today.    Please assist.  Royann ShiversWynona Canes [phone]

## 2020-01-10 ENCOUNTER — Other Ambulatory Visit: Payer: Self-pay | Admitting: Family Medicine

## 2020-01-10 NOTE — Telephone Encounter (Signed)
Please advise? LOV 09/28/19

## 2020-01-10 NOTE — Progress Notes (Signed)
Patient: Brittany Acevedo Female    DOB: July 25, 1941   79 y.o.   MRN: [id number] Visit Date: 01/11/2020  Today's Provider: Wilhemena Durie, MD   Chief Complaint  Patient presents with  . Follow-up   Subjective:     HPI  Patient has lost her husband and 3 grandchildren in the last few years.  She quit smoking a year ago when her husband died. Her complaint is 1 of chronic pain.  She states it is  "my fibromyalgia."  She states tramadol works for the pain. Essential hypertension From 11/15/2019-no changes made.  Weight loss, unintentional From 11/15/2019-This needs to be an office visit, not a phone call.  Arrange so in the next couple of weeks. Very concerned about the possibility of malignancy. I discussed the assessment and treatment plan with the patient. The patient was provided an opportunity to ask questions and all were answered. The patient agreed with the plan and demonstrated an understanding of the instructions. Her weight in the ED in December was 111 pounds.  Her daughter-in-law states that in the past she has had some visual hallucinations.  She has not had those recently. She has had no recent falls.   Allergies  Allergen Reactions  . Gabapentin Anaphylaxis    Throat closes  . Darvon [Propoxyphene]     hives  . Shellfish Allergy     "causes problems with my fibromyalgia"     Current Outpatient Medications:  .  amLODipine (NORVASC) 10 MG tablet, Take 1 tablet (10 mg total) by mouth daily., Disp: 30 tablet, Rfl: 5 .  aspirin 325 MG tablet, Take 325 mg by mouth daily., Disp: , Rfl:  .  buPROPion (WELLBUTRIN SR) 150 MG 12 hr tablet, Take 1 tablet (150 mg total) by mouth 2 (two) times daily., Disp: 60 tablet, Rfl: 0 .  calcium-vitamin D (OSCAL) 250-125 MG-UNIT per tablet, Take 1 tablet by mouth daily., Disp: , Rfl:  .  clonazePAM (KLONOPIN) 0.5 MG tablet, Take 1 tablet (0.5 mg total) by mouth 2 (two) times daily., Disp: 60 tablet, Rfl: 0 .  clopidogrel  (PLAVIX) 75 MG tablet, 1 Tablet, Oral, daily, by mouth (Patient taking differently: Take 75 mg by mouth daily. ), Disp: 90 tablet, Rfl: 3 .  donepezil (ARICEPT) 10 MG tablet, Take 1 tablet (10 mg total) by mouth at bedtime., Disp: 30 tablet, Rfl: 11 .  fluticasone (FLONASE) 50 MCG/ACT nasal spray, Place 1-2 sprays into the nose daily as needed for allergies. , Disp: , Rfl:  .  glucose blood test strip, Use as instructed, Disp: 100 each, Rfl: 12 .  isosorbide mononitrate (IMDUR) 30 MG 24 hr tablet, Take 1 tablet (30 mg total) by mouth daily. (Patient taking differently: Take 30 mg by mouth daily. ), Disp: 90 tablet, Rfl: 3 .  metoprolol tartrate (LOPRESSOR) 25 MG tablet, Take 0.5 tablets (12.5 mg total) by mouth 2 (two) times daily. (Patient taking differently: Take 12.5 mg by mouth 2 (two) times daily. ), Disp: 90 tablet, Rfl: 1 .  nitroGLYCERIN (NITROSTAT) 0.4 MG SL tablet, Place 1 tablet (0.4 mg total) under the tongue every 5 (five) minutes as needed for chest pain., Disp: 25 tablet, Rfl: 11 .  Omega-3 Fatty Acids (FISH OIL) 1000 MG CAPS, Take 1,000 mg by mouth daily. , Disp: , Rfl:  .  ONETOUCH DELICA LANCETS 07P MISC, Use as instructed, Disp: 100 each, Rfl: 11 .  pantoprazole (PROTONIX) 40 MG tablet, Take 1  tablet (40 mg total) by mouth daily., Disp: 90 tablet, Rfl: 0 .  PROAIR HFA 108 (90 Base) MCG/ACT inhaler, Inhale 2 puffs into the lungs every 6 (six) hours as needed for wheezing or shortness of breath., Disp: 18 g, Rfl: 2 .  traMADol (ULTRAM) 50 MG tablet, TAKE (1) TABLET EVERY SIX HOURS AS NEEDED., Disp: 100 tablet, Rfl: 0 .  atorvastatin (LIPITOR) 10 MG tablet, Take 1 tablet (10 mg total) by mouth daily. (Patient not taking: Reported on 01/11/2020), Disp: 90 tablet, Rfl: 0 .  ferrous sulfate 325 (65 FE) MG tablet, Take 1 tablet (325 mg total) by mouth 2 (two) times daily with a meal. (Patient not taking: Reported on 11/15/2019), Disp: 60 tablet, Rfl: 5  Review of Systems  Constitutional:  Negative for appetite change, chills, fatigue and fever.  Eyes: Negative.   Respiratory: Negative for chest tightness and shortness of breath.   Cardiovascular: Negative for chest pain and palpitations.  Gastrointestinal: Negative for abdominal pain, nausea and vomiting.  Endocrine: Negative.   Genitourinary: Negative.   Musculoskeletal: Positive for arthralgias and myalgias.  Skin: Negative.   Neurological: Negative for dizziness and weakness.  Hematological: Negative.     Social History   Tobacco Use  . Smoking status: Current Every Day Smoker    Packs/day: 1.50    Years: 55.00    Pack years: 82.50    Types: Cigarettes  . Smokeless tobacco: Never Used  Substance Use Topics  . Alcohol use: No      Objective:   BP (!) 166/66 (BP Location: Right Arm, Patient Position: Sitting, Cuff Size: Large)   Pulse 62   Temp (!) 97.5 F (36.4 C) (Other (Comment))   Resp 18   Ht 5' (1.524 m)   Wt 119 lb 6.4 oz (54.2 kg)   SpO2 98%   BMI 23.32 kg/m  Vitals:   01/11/20 1139  BP: (!) 166/66  Pulse: 62  Resp: 18  Temp: (!) 97.5 F (36.4 C)  TempSrc: Other (Comment)  SpO2: 98%  Weight: 119 lb 6.4 oz (54.2 kg)  Height: 5' (1.524 m)  Body mass index is 23.32 kg/m.   Physical Exam Vitals reviewed.  Constitutional:      General: She is not in acute distress.    Appearance: She is well-developed. She is not toxic-appearing.     Comments: Patient in wheelchair on exam today  HENT:     Head: Normocephalic and atraumatic.     Right Ear: External ear normal.     Left Ear: External ear normal.     Nose: Nose normal.  Eyes:     General: No scleral icterus.    Conjunctiva/sclera: Conjunctivae normal.  Neck:     Thyroid: No thyromegaly.  Cardiovascular:     Rate and Rhythm: Normal rate and regular rhythm.     Heart sounds: Normal heart sounds.  Pulmonary:     Effort: Pulmonary effort is normal.  Abdominal:     Palpations: Abdomen is soft.  Skin:    General: Skin is warm  and dry.  Neurological:     General: No focal deficit present.     Mental Status: She is alert and oriented to person, place, and time.  Psychiatric:        Mood and Affect: Mood normal.        Behavior: Behavior normal.        Thought Content: Thought content normal.        Judgment: Judgment normal.  No results found for any visits on 01/11/20.     Assessment & Plan    1. 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 Patient is historically noncompliant with follow-up in this office.  She seems to be taking her medications  2. Dementia with behavioral disturbance, unspecified dementia type (HCC) MMSE is 29/30 today.  This could be related to her depression.  Family seems to be attentive to patient in the home.  She lives with her daughter-in-law and son.  Daughter-in-law present today.  3. Hypercholesteremia On Lipitor  4. Depression, unspecified depression type On clonazepam for anxiety.  Would probably benefit from an SSRI in the future. She is achy 1 Wellbutrin daily and doing well with this. 5. Anxiety Pinazepam.  6. Other chronic pain "Fibromyalgia".  Could be arthritic or degenerative disc also.  On the whole I think it is fairly safe to refill tramadol 3 times a day as needed.  7. Essential hypertension Presently not controlled.  May need to increase losartan dose - losartan (COZAAR) 50 MG tablet; Take 1 tablet (50 mg total) by mouth daily.  Dispense: 90 tablet; Refill: 3  8. Weight loss Weight is up 16 pounds from her visit in December.  She was 103 here and was 111 in the ED the same month.  Either way, her weight is improved and she looks better today than she did in December. Again, she is noncompliant with follow-up and recommendations.  I will see her back this summer.     Mashawn Brazil Wendelyn Breslow, MD  [facility]

## 2020-01-10 NOTE — Telephone Encounter (Signed)
Refill request for TRAMADOL HCL 50 MG TABLET. Med can not be delegated.

## 2020-01-11 ENCOUNTER — Encounter: Payer: Self-pay | Admitting: Family Medicine

## 2020-01-11 ENCOUNTER — Ambulatory Visit (INDEPENDENT_AMBULATORY_CARE_PROVIDER_SITE_OTHER): Payer: Medicare Other | Admitting: Family Medicine

## 2020-01-11 ENCOUNTER — Other Ambulatory Visit: Payer: Self-pay

## 2020-01-11 VITALS — BP 166/66 | HR 62 | Temp 97.5°F | Resp 18 | Ht 60.0 in | Wt 119.4 lb

## 2020-01-11 DIAGNOSIS — F419 Anxiety disorder, unspecified: Secondary | ICD-10-CM

## 2020-01-11 DIAGNOSIS — E7800 Pure hypercholesterolemia, unspecified: Secondary | ICD-10-CM | POA: Diagnosis not present

## 2020-01-11 DIAGNOSIS — G8929 Other chronic pain: Secondary | ICD-10-CM

## 2020-01-11 DIAGNOSIS — I25708 Atherosclerosis of coronary artery bypass graft(s), unspecified, with other forms of angina pectoris: Secondary | ICD-10-CM

## 2020-01-11 DIAGNOSIS — I10 Essential (primary) hypertension: Secondary | ICD-10-CM

## 2020-01-11 DIAGNOSIS — F0391 Unspecified dementia with behavioral disturbance: Secondary | ICD-10-CM

## 2020-01-11 DIAGNOSIS — F329 Major depressive disorder, single episode, unspecified: Secondary | ICD-10-CM

## 2020-01-11 DIAGNOSIS — F32A Depression, unspecified: Secondary | ICD-10-CM

## 2020-01-11 DIAGNOSIS — R634 Abnormal weight loss: Secondary | ICD-10-CM

## 2020-01-11 MED ORDER — LOSARTAN POTASSIUM 50 MG PO TABS: 50.0000 mg | ORAL_TABLET | Freq: Every day | ORAL | 3 refills | Status: AC

## 2020-01-11 NOTE — Patient Instructions (Signed)
For back pain try over-the-counter Voltaren Gel.

## 2020-01-28 ENCOUNTER — Telehealth: Payer: Self-pay

## 2020-01-28 NOTE — Telephone Encounter (Signed)
Copied from CRM [id number]. Topic: General - Other >> Jan 28, 2020 10:15 AM Jaquita Rector A wrote: Reason for CRM: Patient daughter in Law Morrison Old called to inquire of Dr Sullivan Lone why was Rx for traMADol (ULTRAM) 50 MG tablet written for 100 tabs instead of 120 tabs which would last for the 30 days since patient take medication 4 times a day. She is asking for a call back at Ph# [phone]

## 2020-01-28 NOTE — Telephone Encounter (Signed)
Please advise? Brittany Acevedo is aware Dr. Sullivan Lone is out until Monday.

## 2020-02-06 ENCOUNTER — Inpatient Hospital Stay
Admission: EM | Admit: 2020-02-06 | Discharge: 2020-02-12 | DRG: 291 | Disposition: E | Payer: Medicare Other | Attending: Internal Medicine | Admitting: Internal Medicine

## 2020-02-06 ENCOUNTER — Emergency Department: Payer: Medicare Other

## 2020-02-06 ENCOUNTER — Other Ambulatory Visit: Payer: Self-pay

## 2020-02-06 DIAGNOSIS — R069 Unspecified abnormalities of breathing: Secondary | ICD-10-CM | POA: Diagnosis not present

## 2020-02-06 DIAGNOSIS — J9602 Acute respiratory failure with hypercapnia: Secondary | ICD-10-CM | POA: Diagnosis present

## 2020-02-06 DIAGNOSIS — J439 Emphysema, unspecified: Secondary | ICD-10-CM | POA: Diagnosis not present

## 2020-02-06 DIAGNOSIS — F0390 Unspecified dementia without behavioral disturbance: Secondary | ICD-10-CM | POA: Diagnosis present

## 2020-02-06 DIAGNOSIS — Z20822 Contact with and (suspected) exposure to covid-19: Secondary | ICD-10-CM | POA: Diagnosis not present

## 2020-02-06 DIAGNOSIS — I110 Hypertensive heart disease with heart failure: Secondary | ICD-10-CM | POA: Diagnosis not present

## 2020-02-06 DIAGNOSIS — G9341 Metabolic encephalopathy: Secondary | ICD-10-CM | POA: Diagnosis not present

## 2020-02-06 DIAGNOSIS — D631 Anemia in chronic kidney disease: Secondary | ICD-10-CM | POA: Diagnosis not present

## 2020-02-06 DIAGNOSIS — Z7902 Long term (current) use of antithrombotics/antiplatelets: Secondary | ICD-10-CM

## 2020-02-06 DIAGNOSIS — R0689 Other abnormalities of breathing: Secondary | ICD-10-CM | POA: Diagnosis not present

## 2020-02-06 DIAGNOSIS — F329 Major depressive disorder, single episode, unspecified: Secondary | ICD-10-CM | POA: Diagnosis present

## 2020-02-06 DIAGNOSIS — E872 Acidosis: Secondary | ICD-10-CM | POA: Diagnosis not present

## 2020-02-06 DIAGNOSIS — N1832 Chronic kidney disease, stage 3b: Secondary | ICD-10-CM | POA: Diagnosis present

## 2020-02-06 DIAGNOSIS — Z8249 Family history of ischemic heart disease and other diseases of the circulatory system: Secondary | ICD-10-CM

## 2020-02-06 DIAGNOSIS — E7800 Pure hypercholesterolemia, unspecified: Secondary | ICD-10-CM | POA: Diagnosis present

## 2020-02-06 DIAGNOSIS — K219 Gastro-esophageal reflux disease without esophagitis: Secondary | ICD-10-CM | POA: Diagnosis not present

## 2020-02-06 DIAGNOSIS — Z9861 Coronary angioplasty status: Secondary | ICD-10-CM

## 2020-02-06 DIAGNOSIS — N179 Acute kidney failure, unspecified: Secondary | ICD-10-CM

## 2020-02-06 DIAGNOSIS — F17210 Nicotine dependence, cigarettes, uncomplicated: Secondary | ICD-10-CM | POA: Diagnosis present

## 2020-02-06 DIAGNOSIS — J9601 Acute respiratory failure with hypoxia: Secondary | ICD-10-CM | POA: Diagnosis present

## 2020-02-06 DIAGNOSIS — I5031 Acute diastolic (congestive) heart failure: Secondary | ICD-10-CM | POA: Diagnosis not present

## 2020-02-06 DIAGNOSIS — Z825 Family history of asthma and other chronic lower respiratory diseases: Secondary | ICD-10-CM

## 2020-02-06 DIAGNOSIS — I4510 Unspecified right bundle-branch block: Secondary | ICD-10-CM | POA: Diagnosis not present

## 2020-02-06 DIAGNOSIS — Z888 Allergy status to other drugs, medicaments and biological substances status: Secondary | ICD-10-CM

## 2020-02-06 DIAGNOSIS — I130 Hypertensive heart and chronic kidney disease with heart failure and stage 1 through stage 4 chronic kidney disease, or unspecified chronic kidney disease: Principal | ICD-10-CM | POA: Diagnosis present

## 2020-02-06 DIAGNOSIS — I160 Hypertensive urgency: Secondary | ICD-10-CM | POA: Diagnosis not present

## 2020-02-06 DIAGNOSIS — I2510 Atherosclerotic heart disease of native coronary artery without angina pectoris: Secondary | ICD-10-CM | POA: Diagnosis present

## 2020-02-06 DIAGNOSIS — I252 Old myocardial infarction: Secondary | ICD-10-CM | POA: Diagnosis not present

## 2020-02-06 DIAGNOSIS — R001 Bradycardia, unspecified: Secondary | ICD-10-CM | POA: Diagnosis not present

## 2020-02-06 DIAGNOSIS — Z66 Do not resuscitate: Secondary | ICD-10-CM | POA: Diagnosis not present

## 2020-02-06 DIAGNOSIS — I255 Ischemic cardiomyopathy: Secondary | ICD-10-CM | POA: Diagnosis present

## 2020-02-06 DIAGNOSIS — Z743 Need for continuous supervision: Secondary | ICD-10-CM | POA: Diagnosis not present

## 2020-02-06 DIAGNOSIS — Z91013 Allergy to seafood: Secondary | ICD-10-CM

## 2020-02-06 DIAGNOSIS — N189 Chronic kidney disease, unspecified: Secondary | ICD-10-CM | POA: Diagnosis not present

## 2020-02-06 DIAGNOSIS — R509 Fever, unspecified: Secondary | ICD-10-CM | POA: Diagnosis not present

## 2020-02-06 DIAGNOSIS — E785 Hyperlipidemia, unspecified: Secondary | ICD-10-CM | POA: Diagnosis not present

## 2020-02-06 DIAGNOSIS — Z832 Family history of diseases of the blood and blood-forming organs and certain disorders involving the immune mechanism: Secondary | ICD-10-CM

## 2020-02-06 DIAGNOSIS — I509 Heart failure, unspecified: Secondary | ICD-10-CM | POA: Diagnosis not present

## 2020-02-06 DIAGNOSIS — Z833 Family history of diabetes mellitus: Secondary | ICD-10-CM

## 2020-02-06 DIAGNOSIS — J9600 Acute respiratory failure, unspecified whether with hypoxia or hypercapnia: Secondary | ICD-10-CM | POA: Diagnosis present

## 2020-02-06 DIAGNOSIS — M797 Fibromyalgia: Secondary | ICD-10-CM | POA: Diagnosis not present

## 2020-02-06 DIAGNOSIS — Z8261 Family history of arthritis: Secondary | ICD-10-CM

## 2020-02-06 DIAGNOSIS — Z885 Allergy status to narcotic agent status: Secondary | ICD-10-CM

## 2020-02-06 DIAGNOSIS — N281 Cyst of kidney, acquired: Secondary | ICD-10-CM | POA: Diagnosis not present

## 2020-02-06 DIAGNOSIS — I5021 Acute systolic (congestive) heart failure: Secondary | ICD-10-CM | POA: Diagnosis not present

## 2020-02-06 DIAGNOSIS — R0602 Shortness of breath: Secondary | ICD-10-CM | POA: Diagnosis not present

## 2020-02-06 LAB — CBC WITH DIFFERENTIAL/PLATELET
Abs Immature Granulocytes: 0.1 10*3/uL — ABNORMAL HIGH (ref 0.00–0.07)
Basophils Absolute: 0 10*3/uL (ref 0.0–0.1)
Basophils Relative: 0 %
Eosinophils Absolute: 0 10*3/uL (ref 0.0–0.5)
Eosinophils Relative: 0 %
HCT: 33.6 % — ABNORMAL LOW (ref 36.0–46.0)
Hemoglobin: 10.6 g/dL — ABNORMAL LOW (ref 12.0–15.0)
Immature Granulocytes: 1 %
Lymphocytes Relative: 10 %
Lymphs Abs: 1.4 10*3/uL (ref 0.7–4.0)
MCH: 26.8 pg (ref 26.0–34.0)
MCHC: 31.5 g/dL (ref 30.0–36.0)
MCV: 84.8 fL (ref 80.0–100.0)
Monocytes Absolute: 0.6 10*3/uL (ref 0.1–1.0)
Monocytes Relative: 4 %
Neutro Abs: 11.7 10*3/uL — ABNORMAL HIGH (ref 1.7–7.7)
Neutrophils Relative %: 85 %
Platelets: 126 10*3/uL — ABNORMAL LOW (ref 150–400)
RBC: 3.96 MIL/uL (ref 3.87–5.11)
RDW: 17.2 % — ABNORMAL HIGH (ref 11.5–15.5)
WBC: 13.8 10*3/uL — ABNORMAL HIGH (ref 4.0–10.5)
nRBC: 2.2 % — ABNORMAL HIGH (ref 0.0–0.2)

## 2020-02-06 LAB — URINALYSIS, COMPLETE (UACMP) WITH MICROSCOPIC
Glucose, UA: 100 mg/dL — AB
Ketones, ur: NEGATIVE mg/dL
Leukocytes,Ua: NEGATIVE
Nitrite: NEGATIVE
Protein, ur: >300 mg/dL — AB
Specific Gravity, Urine: >1.03 — ABNORMAL HIGH (ref 1.005–1.030)
Squamous Epithelial / HPF: NONE SEEN (ref 0–5)
pH: 5 (ref 5.0–8.0)

## 2020-02-06 LAB — COMPREHENSIVE METABOLIC PANEL
ALT: 108 U/L — ABNORMAL HIGH (ref 0–44)
AST: 160 U/L — ABNORMAL HIGH (ref 15–41)
Albumin: 2 g/dL — ABNORMAL LOW (ref 3.5–5.0)
Alkaline Phosphatase: 95 U/L (ref 38–126)
Anion gap: 13 (ref 5–15)
BUN: 67 mg/dL — ABNORMAL HIGH (ref 8–23)
CO2: 20 mmol/L — ABNORMAL LOW (ref 22–32)
Calcium: 8.2 mg/dL — ABNORMAL LOW (ref 8.9–10.3)
Chloride: 102 mmol/L (ref 98–111)
Creatinine, Ser: 4.35 mg/dL — ABNORMAL HIGH (ref 0.44–1.00)
GFR calc Af Amer: 10 mL/min — ABNORMAL LOW (ref >60)
GFR calc non Af Amer: 9 mL/min — ABNORMAL LOW (ref >60)
Glucose, Bld: 119 mg/dL — ABNORMAL HIGH (ref 70–99)
Potassium: 4.3 mmol/L (ref 3.5–5.1)
Sodium: 135 mmol/L (ref 135–145)
Total Bilirubin: 1.3 mg/dL — ABNORMAL HIGH (ref 0.3–1.2)
Total Protein: 6 g/dL — ABNORMAL LOW (ref 6.5–8.1)

## 2020-02-06 LAB — BLOOD GAS, VENOUS
Acid-base deficit: 9.5 mmol/L — ABNORMAL HIGH (ref 0.0–2.0)
Bicarbonate: 19.9 mmol/L — ABNORMAL LOW (ref 20.0–28.0)
Delivery systems: POSITIVE
FIO2: 80
O2 Saturation: 65.4 %
Patient temperature: 37
pCO2, Ven: 57 mmHg (ref 44.0–60.0)
pH, Ven: 7.15 — CL (ref 7.250–7.430)
pO2, Ven: 45 mmHg (ref 32.0–45.0)

## 2020-02-06 LAB — GLUCOSE, CAPILLARY: Glucose-Capillary: 89 mg/dL (ref 70–99)

## 2020-02-06 LAB — BRAIN NATRIURETIC PEPTIDE: B Natriuretic Peptide: >4500 pg/mL — ABNORMAL HIGH (ref 0.0–100.0)

## 2020-02-06 LAB — LACTIC ACID, PLASMA
Lactic Acid, Venous: 4.3 mmol/L (ref 0.5–1.9)
Lactic Acid, Venous: 5.1 mmol/L (ref 0.5–1.9)

## 2020-02-06 LAB — TYPE AND SCREEN
ABO/RH(D): AB POS
Antibody Screen: NEGATIVE

## 2020-02-06 LAB — RESPIRATORY PANEL BY RT PCR (FLU A&B, COVID)
Influenza A by PCR: NEGATIVE
Influenza B by PCR: NEGATIVE
SARS Coronavirus 2 by RT PCR: NEGATIVE

## 2020-02-06 LAB — MRSA PCR SCREENING: MRSA by PCR: NEGATIVE

## 2020-02-06 MED ORDER — METHYLPREDNISOLONE SODIUM SUCC 125 MG IJ SOLR
125.0000 mg | Freq: Once | INTRAMUSCULAR | Status: AC
  Administered 2020-02-06: 125 mg via INTRAVENOUS
  Filled 2020-02-06: qty 2

## 2020-02-06 MED ORDER — FUROSEMIDE 10 MG/ML IJ SOLN
40.0000 mg | Freq: Two times a day (BID) | INTRAMUSCULAR | Status: DC
  Administered 2020-02-07: 06:00:00 40 mg via INTRAVENOUS
  Filled 2020-02-06: qty 4

## 2020-02-06 MED ORDER — FUROSEMIDE 10 MG/ML IJ SOLN
40.0000 mg | Freq: Once | INTRAMUSCULAR | Status: AC
  Administered 2020-02-06: 20:00:00 40 mg via INTRAVENOUS
  Filled 2020-02-06: qty 4

## 2020-02-06 MED ORDER — PANTOPRAZOLE SODIUM 40 MG PO TBEC: 40.0000 mg | DELAYED_RELEASE_TABLET | Freq: Every day | ORAL | Status: DC

## 2020-02-06 MED ORDER — SODIUM CHLORIDE 0.9 % IV SOLN
2.0000 g | INTRAVENOUS | Status: DC
  Administered 2020-02-06: 22:00:00 2 g via INTRAVENOUS
  Filled 2020-02-06 (×2): qty 20

## 2020-02-06 MED ORDER — ONDANSETRON HCL 4 MG/2ML IJ SOLN
4.0000 mg | Freq: Four times a day (QID) | INTRAMUSCULAR | Status: DC | PRN
  Administered 2020-02-06: 23:00:00 4 mg via INTRAVENOUS
  Filled 2020-02-06: qty 2

## 2020-02-06 MED ORDER — VANCOMYCIN HCL IN DEXTROSE 1-5 GM/200ML-% IV SOLN
1000.0000 mg | Freq: Once | INTRAVENOUS | Status: AC
  Administered 2020-02-06: 20:00:00 1000 mg via INTRAVENOUS
  Filled 2020-02-06: qty 200

## 2020-02-06 MED ORDER — METOPROLOL TARTRATE 25 MG PO TABS: 12.5000 mg | ORAL_TABLET | Freq: Two times a day (BID) | ORAL | Status: DC

## 2020-02-06 MED ORDER — ASPIRIN EC 81 MG PO TBEC: 81.0000 mg | DELAYED_RELEASE_TABLET | Freq: Every day | ORAL | Status: DC

## 2020-02-06 MED ORDER — SODIUM CHLORIDE 0.9 % IV SOLN
500.0000 mg | INTRAVENOUS | Status: DC
  Administered 2020-02-06: 23:00:00 500 mg via INTRAVENOUS
  Filled 2020-02-06 (×2): qty 500

## 2020-02-06 MED ORDER — GUAIFENESIN ER 600 MG PO TB12: 600.0000 mg | ORAL_TABLET | Freq: Two times a day (BID) | ORAL | Status: DC

## 2020-02-06 MED ORDER — ATORVASTATIN CALCIUM 10 MG PO TABS: 10.0000 mg | ORAL_TABLET | Freq: Every day | ORAL | Status: DC

## 2020-02-06 MED ORDER — IPRATROPIUM-ALBUTEROL 0.5-2.5 (3) MG/3ML IN SOLN
3.0000 mL | Freq: Once | RESPIRATORY_TRACT | Status: AC
  Administered 2020-02-06: 18:00:00 3 mL via RESPIRATORY_TRACT
  Filled 2020-02-06: qty 3

## 2020-02-06 MED ORDER — NITROGLYCERIN 0.4 MG SL SUBL: 0.4000 mg | SUBLINGUAL_TABLET | SUBLINGUAL | Status: DC | PRN

## 2020-02-06 MED ORDER — HEPARIN SODIUM (PORCINE) 5000 UNIT/ML IJ SOLN
5000.0000 [IU] | Freq: Two times a day (BID) | INTRAMUSCULAR | Status: DC
  Administered 2020-02-06: 5000 [IU] via SUBCUTANEOUS
  Filled 2020-02-06: qty 1

## 2020-02-06 MED ORDER — TRAMADOL HCL 50 MG PO TABS: 50.0000 mg | ORAL_TABLET | Freq: Four times a day (QID) | ORAL | Status: DC | PRN

## 2020-02-06 MED ORDER — CLOPIDOGREL BISULFATE 75 MG PO TABS: 75.0000 mg | ORAL_TABLET | Freq: Every day | ORAL | Status: DC

## 2020-02-06 MED ORDER — SODIUM CHLORIDE 0.9% FLUSH: 3.0000 mL | INTRAVENOUS | Status: DC | PRN

## 2020-02-06 MED ORDER — ACETAMINOPHEN 325 MG PO TABS: 650.0000 mg | ORAL_TABLET | ORAL | Status: DC | PRN

## 2020-02-06 MED ORDER — ALUM & MAG HYDROXIDE-SIMETH 200-200-20 MG/5ML PO SUSP: 30.0000 mL | ORAL | Status: DC | PRN

## 2020-02-06 MED ORDER — SODIUM CHLORIDE 0.9 % IV SOLN: 250.0000 mL | INTRAVENOUS | Status: DC | PRN

## 2020-02-06 MED ORDER — ISOSORBIDE MONONITRATE ER 30 MG PO TB24: 30.0000 mg | ORAL_TABLET | Freq: Every day | ORAL | Status: DC

## 2020-02-06 MED ORDER — DONEPEZIL HCL 5 MG PO TABS
10.0000 mg | ORAL_TABLET | Freq: Every day | ORAL | Status: DC
  Filled 2020-02-06 (×2): qty 2

## 2020-02-06 MED ORDER — MAGNESIUM HYDROXIDE 400 MG/5ML PO SUSP: 30.0000 mL | Freq: Every day | ORAL | Status: DC | PRN

## 2020-02-06 MED ORDER — SODIUM CHLORIDE 0.9% FLUSH
3.0000 mL | Freq: Two times a day (BID) | INTRAVENOUS | Status: DC
  Administered 2020-02-06: 22:00:00 3 mL via INTRAVENOUS

## 2020-02-06 MED ORDER — BUPROPION HCL ER (SR) 150 MG PO TB12
150.0000 mg | ORAL_TABLET | Freq: Every day | ORAL | Status: DC
  Filled 2020-02-06: qty 1

## 2020-02-06 MED ORDER — CHLORHEXIDINE GLUCONATE CLOTH 2 % EX PADS
6.0000 | MEDICATED_PAD | Freq: Every day | CUTANEOUS | Status: DC
  Administered 2020-02-06: 22:00:00 6 via TOPICAL
  Filled 2020-02-06 (×2): qty 6

## 2020-02-06 MED ORDER — PIPERACILLIN-TAZOBACTAM 3.375 G IVPB 30 MIN
3.3750 g | Freq: Once | INTRAVENOUS | Status: AC
  Administered 2020-02-06: 20:00:00 3.375 g via INTRAVENOUS
  Filled 2020-02-06: qty 50

## 2020-02-06 NOTE — ED Triage Notes (Signed)
Per EMS pt c/o SOB and right flank pain and dark urine. BGL 118. Pt is altered at baseline. Pt placed on Non rebreathes w/ saturation improvement from 65% on RA to 88%.  Pt is lethargic, extremities appear mottled, cool to touch. Pt placed on bipap upon arrival.

## 2020-02-06 NOTE — ED Provider Notes (Signed)
[facility] Emergency Department Provider Note       Time seen: ----------------------------------------- 6:45 PM on 01/19/2020 -----------------------------------------   I have reviewed the triage vital signs and the nursing notes. Level V caveat: History/ROS limited by altered mental status HISTORY   Chief Complaint Shortness of Breath    HPI Brittany Acevedo is a 79 y.o. female with a history of anemia, asthma, COPD, fibromyalgia, GERD, hypertension who presents to the ED for shortness of breath and right flank pain.  Patient reportedly had dark urine, oxygen saturation was 88% on a nonrebreather prior to arrival.  She arrives lethargic, altered with mottled extremities.  She was placed on BiPAP on arrival.  Past Medical History:  Diagnosis Date  . Anemia   . Asthma   . Broken ankle   . Emphysema lung (HCC)   . Fibromyalgia   . GERD (gastroesophageal reflux disease)   . Heart attack (HCC) 2013  . Hemorrhoids   . Hypertension   . Peripheral nervous system disorder   . Shortness of breath dyspnea   . Sinus trouble   . Wears dentures    full upper    Patient Active Problem List   Diagnosis Date Noted  . Hypertension 03/26/2019  . Personal history of tobacco use, presenting hazards to health 10/15/2016  . Chronic pain 09/26/2016  . Hyperglycemia 05/09/2015  . Pelvic pain in female 05/09/2015  . Constipation 05/09/2015  . Anxiety 04/22/2015  . Autoimmune disease (HCC) 04/22/2015  . Body mass index (BMI) of 28.0-28.9 in adult 04/22/2015  . Atherosclerosis of coronary artery 04/22/2015  . Chronic headache 04/22/2015  . Dementia (HCC) 04/22/2015  . Clinical depression 04/22/2015  . Dizziness 04/22/2015  . Family history of diabetes mellitus 04/22/2015  . Broken rib 04/22/2015  . Acid reflux 04/22/2015  . Cannot sleep 04/22/2015  . Amnesia 04/22/2015  . Cramp in muscle 04/22/2015  . Excessive urination at night 04/22/2015  . Decreased  potassium in the blood 04/22/2015  . Excessive falling 04/22/2015  . Pain in rib 04/22/2015  . History of biliary T-tube placement 04/22/2015  . Iron deficiency anemia 06/06/2014  . Anemia 05/02/2014  . Dyspnea 04/11/2014  . Tobacco abuse 04/11/2014  . 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 Fibrositis 03/16/2014  . Heart attack (HCC) 03/16/2014  . Hypercholesteremia 04/15/2006    Past Surgical History:  Procedure Laterality Date  . ABDOMINAL HYSTERECTOMY     w/ removal of ovarian cyst  . ANGIOPLASTY  2013  . APPENDECTOMY    . cyst removed from face  2008  . PARTIAL HYSTERECTOMY  1980  . TUBAL LIGATION  1970    Allergies Gabapentin, Darvon [propoxyphene], and Shellfish allergy  Social History Social History   Tobacco Use  . Smoking status: Current Every Day Smoker    Packs/day: 1.50    Years: 55.00    Pack years: 82.50    Types: Cigarettes  . Smokeless tobacco: Never Used  Substance Use Topics  . Alcohol use: No  . Drug use: No    Review of Systems Unknown, patient with altered mental status and flank pain, severe shortness of breath  All systems negative/normal/unremarkable except as stated in the HPI  ____________________________________________   PHYSICAL EXAM:  VITAL SIGNS: ED Triage Vitals  Enc Vitals Group     BP 01/24/2020 1802 (!) 165/110     Pulse Rate 01/15/2020 1802 (!) 114     Resp 01/20/2020 1802 (!) 23  Temp 01/24/2020 1810 98 F (36.7 C)     Temp Source 02/11/2020 1810 Rectal     SpO2 01/13/2020 1802 100 %     Weight 02/03/2020 1803 140 lb (63.5 kg)     Height 02/03/2020 1803 5\' 5"  (1.651 m)     Head Circumference --      Peak Flow --      Pain Score 01/23/2020 1802 0     Pain Loc --      Pain Edu? --      Excl. in [place]? --     Constitutional: Patient is lethargic but responds to verbal or painful stimuli, moderate to severe distress  Eyes: Conjunctivae are normal. Normal extraocular movements. ENT      Head:  Normocephalic and atraumatic.      Nose: No congestion/rhinnorhea.      Mouth/Throat: Mucous membranes are moist.      Neck: No stridor. Cardiovascular: Normal rate, regular rhythm. No murmurs, rubs, or gallops. Respiratory: Tachypnea with diminished breath sounds and rales bilaterally Gastrointestinal: Soft and nontender. Normal bowel sounds Musculoskeletal: Nontender with normal range of motion in extremities.  Mild edema is noted Neurologic:  No gross focal neurologic deficits are appreciated.  She is nonverbal at this time Skin:  Skin is warm, dry and intact. No rash noted. Psychiatric: Poorly responsive ____________________________________________  EKG: Interpreted by me.  Sinus rhythm with rate of 80 bpm, right bundle branch block, leftward axis, long QT  ____________________________________________  ED COURSE:  As part of my medical decision making, I reviewed the following data within the [place] History obtained from family if available, nursing notes, old chart and ekg, as well as notes from prior ED visits. Patient presented for respiratory distress, we will assess with labs and imaging as indicated at this time.   Procedures  Brittany Acevedo was evaluated in Emergency Department on 01/21/2020 for the symptoms described in the history of present illness. She was evaluated in the context of the global COVID-19 pandemic, which necessitated consideration that the patient might be at risk for infection with the SARS-CoV-2 virus that causes COVID-19. Institutional protocols and algorithms that pertain to the evaluation of patients at risk for COVID-19 are in a state of rapid change based on information released by regulatory bodies including the CDC and federal and state organizations. These policies and algorithms were followed during the patient's care in the ED.  ____________________________________________   LABS (pertinent positives/negatives)  Labs Reviewed   LACTIC ACID, PLASMA - Abnormal; Notable for the following components:      Result Value   Lactic Acid, Venous 4.3 (*)    All other components within normal limits  COMPREHENSIVE METABOLIC PANEL - Abnormal; Notable for the following components:   CO2 20 (*)    Glucose, Bld 119 (*)    BUN 67 (*)    Creatinine, Ser 4.35 (*)    Calcium 8.2 (*)    Total Protein 6.0 (*)    Albumin 2.0 (*)    AST 160 (*)    ALT 108 (*)    Total Bilirubin 1.3 (*)    GFR calc non Af Amer 9 (*)    GFR calc Af Amer 10 (*)    All other components within normal limits  CBC WITH DIFFERENTIAL/PLATELET - Abnormal; Notable for the following components:   WBC 13.8 (*)    Hemoglobin 10.6 (*)    HCT 33.6 (*)    RDW 17.2 (*)  Platelets 126 (*)    nRBC 2.2 (*)    Neutro Abs 11.7 (*)    Abs Immature Granulocytes 0.10 (*)    All other components within normal limits  BLOOD GAS, VENOUS - Abnormal; Notable for the following components:   pH, Ven 7.15 (*)    Bicarbonate 19.9 (*)    Acid-base deficit 9.5 (*)    All other components within normal limits  URINALYSIS, COMPLETE (UACMP) WITH MICROSCOPIC - Abnormal; Notable for the following components:   APPearance CLOUDY (*)    Specific Gravity, Urine >1.030 (*)    Glucose, UA 100 (*)    Hgb urine dipstick SMALL (*)    Bilirubin Urine SMALL (*)    Protein, ur >300 (*)    Bacteria, UA RARE (*)    All other components within normal limits  BRAIN NATRIURETIC PEPTIDE - Abnormal; Notable for the following components:   B Natriuretic Peptide >4,500.0 (*)    All other components within normal limits  RESPIRATORY PANEL BY RT PCR (FLU A&B, COVID)  CULTURE, BLOOD (ROUTINE X 2)  CULTURE, BLOOD (ROUTINE X 2)  URINE CULTURE  LACTIC ACID, PLASMA  TYPE AND SCREEN   CRITICAL CARE Performed by: Ulice Dash   Total critical care time: 30 minutes  Critical care time was exclusive of separately billable procedures and treating other patients.  Critical care was  necessary to treat or prevent imminent or life-threatening deterioration.  Critical care was time spent personally by me on the following activities: development of treatment plan with patient and/or surrogate as well as nursing, discussions with consultants, evaluation of patient's response to treatment, examination of patient, obtaining history from patient or surrogate, ordering and performing treatments and interventions, ordering and review of laboratory studies, ordering and review of radiographic studies, pulse oximetry and re-evaluation of patient's condition.  RADIOLOGY Images were viewed by me  Chest x-ray IMPRESSION: Cardiomegaly with bilateral interstitial and mild airspace opacities and trace bilateral pleural effusions. Favor edema over infection.  ____________________________________________   DIFFERENTIAL DIAGNOSIS   CHF, COPD, pneumonia, PE, COVID-19, sepsis  FINAL ASSESSMENT AND PLAN  Acute respiratory failure, acidemia, acute renal failure, CHF exacerbation   Plan: The patient had presented for respiratory failure. Patient's labs did reveal leukocytosis with a left shift, lactic acidosis, acute renal failure.  Patient's imaging revealed cardiomegaly with bilateral interstitial and mild airspace opacities and trace pleural effusions.  This resembles CHF.  She was placed on BiPAP on arrival and had remarkable improvement in her symptoms.  I have ordered some IV Lasix for her as well as broad-spectrum antibiotics to cover for infection.  I will discuss with the hospitalist for admission.   Ulice Dash, MD    Note: This note was generated in part or whole with voice recognition software. Voice recognition is usually quite accurate but there are transcription errors that can and very often do occur. I apologize for any typographical errors that were not detected and corrected.     Emily Filbert, MD 02/17/2020 Rosamaria Lints

## 2020-02-06 NOTE — H&P (Signed)
[place] at [facility]   PATIENT NAME: Brittany Acevedo    MR#:  [id number]  DATE OF BIRTH:  06-06-1941  DATE OF ADMISSION:  February 11, 2020  PRIMARY CARE PHYSICIAN: Maple Hudson., MD   REQUESTING/REFERRING PHYSICIAN: Daryel November, MD  CHIEF COMPLAINT:   Chief Complaint  Patient presents with  . Shortness of Breath    HISTORY OF PRESENT ILLNESS:  Brittany Acevedo  is a 79 y.o. Caucasian female with a known history of asthma, emphysema, coronary artery disease, hypertension, GERD and fibromyalgia, who presented to the emergency room with acute onset of respiratory distress with oxygen saturation of 80% on nonrebreather prior to arrival to the ER.  She was very lethargic and altered with mottled extremities when she came.  She was immediately placed on BiPAP.Marland Kitchen  Upon presentation to the emergency room, Blood pressure was 165/110 with a heart rate of 80, respiratory of 27 and pulse ox 90 was 96% on BiPAP and later 100%.  Labs revealed a VBG with pH of 7.15 with bicarbonate of 19.9.  CMP was remarkable for BUN of 67 and creatinine of 4.35 up from 19/1.47 on 09/28/2019.  CO2 is 29 calcium 8.2 with albumin of 2 enterocutaneous 6.  AST was elevated 160 and ALT of 108.  BNP was more than 4500 and lactic acid came back 4.3.  CBC showed leukocytosis of 13.8 with neutrophilia and anemia better than previous levels.  Urinalysis showed more than 300 protein specific gravity more than 1030. Labs reveal ABG with EKG showed normal sinus rhythm with rate of 80 with interventricular conduction delay that could be atypical right bundle branch block.  Chest x-ray showed cardiomegaly with bilateral anesthesia pulmonary specialist opacities and trace bilateral pleural effusion favoring edema over infection.  The patient was ordered Lasix 40 mg IV, duo nebs, IV Zosyn and vancomycin and 125 mg of IV Solu-Medrol.  She has significantly improved on BiPAP and was able to open her eyes and communicate.   She will be admitted to a stepdown unit bed for further evaluation and management. PAST MEDICAL HISTORY:   Past Medical History:  Diagnosis Date  . Anemia   . Asthma   . Broken ankle   . Emphysema lung (HCC)   . Fibromyalgia   . GERD (gastroesophageal reflux disease)   . Heart attack (HCC) 2013  . Hemorrhoids   . Hypertension   . Peripheral nervous system disorder   . Shortness of breath dyspnea   . Sinus trouble   . Wears dentures    full upper    PAST SURGICAL HISTORY:   Past Surgical History:  Procedure Laterality Date  . ABDOMINAL HYSTERECTOMY     w/ removal of ovarian cyst  . ANGIOPLASTY  2013  . APPENDECTOMY    . cyst removed from face  2008  . PARTIAL HYSTERECTOMY  1980  . TUBAL LIGATION  1970    SOCIAL HISTORY:   Social History   Tobacco Use  . Smoking status: Current Every Day Smoker    Packs/day: 1.50    Years: 55.00    Pack years: 82.50    Types: Cigarettes  . Smokeless tobacco: Never Used  Substance Use Topics  . Alcohol use: No    FAMILY HISTORY:   Family History  Problem Relation Age of Onset  . Asthma Sister   . Diabetes Sister   . Heart disease Father   . Rheum arthritis Father   . Clotting disorder Sister   .  Rheum arthritis Brother   . Rheum arthritis Sister   . Emphysema Cousin   . Asthma Other   . Heart disease Maternal Uncle   . Heart disease Maternal Grandmother   . Rheum arthritis Son   . Cancer Other     DRUG ALLERGIES:   Allergies  Allergen Reactions  . Gabapentin Anaphylaxis    Throat closes  . Darvon [Propoxyphene]     hives  . Shellfish Allergy     "causes problems with my fibromyalgia"    REVIEW OF SYSTEMS:   ROS As per history of present illness. All pertinent systems were reviewed above. Constitutional,  HEENT, cardiovascular, respiratory, GI, GU, musculoskeletal, neuro, psychiatric, endocrine,  integumentary and hematologic systems were reviewed and are otherwise  negative/unremarkable except for  positive findings mentioned above in the HPI.   MEDICATIONS AT HOME:   Prior to Admission medications   Medication Sig Start Date End Date Taking? Authorizing Provider  atorvastatin (LIPITOR) 10 MG tablet Take 1 tablet (10 mg total) by mouth daily. 10/14/19  Yes Jerrol Banana., MD  buPROPion [facility] SR) 150 MG 12 hr tablet Take 1 tablet (150 mg total) by mouth 2 (two) times daily. Patient taking differently: Take 150 mg by mouth daily.  12/16/19  Yes Jerrol Banana., MD  clopidogrel (PLAVIX) 75 MG tablet 1 Tablet, Oral, daily, by mouth Patient taking differently: Take 75 mg by mouth daily.  12/02/18  Yes Jerrol Banana., MD  donepezil (ARICEPT) 10 MG tablet Take 1 tablet (10 mg total) by mouth at bedtime. 06/23/19  Yes Jerrol Banana., MD  isosorbide mononitrate (IMDUR) 30 MG 24 hr tablet Take 1 tablet (30 mg total) by mouth daily. Patient taking differently: Take 30 mg by mouth daily.  11/02/18  Yes Jerrol Banana., MD  losartan (COZAAR) 50 MG tablet Take 1 tablet (50 mg total) by mouth daily. 01/11/20  Yes Jerrol Banana., MD  metoprolol tartrate (LOPRESSOR) 25 MG tablet Take 0.5 tablets (12.5 mg total) by mouth 2 (two) times daily. Patient taking differently: Take 12.5 mg by mouth 2 (two) times daily.  07/20/19  Yes Jerrol Banana., MD  nitroGLYCERIN (NITROSTAT) 0.4 MG SL tablet Place 1 tablet (0.4 mg total) under the tongue every 5 (five) minutes as needed for chest pain. 01/07/18  Yes Paraschos, Alexander, MD  pantoprazole (PROTONIX) 40 MG tablet Take 1 tablet (40 mg total) by mouth daily. 10/18/19  Yes Jerrol Banana., MD  traMADol (ULTRAM) 50 MG tablet TAKE (1) TABLET EVERY SIX HOURS AS NEEDED. 01/11/20  Yes Jerrol Banana., MD      VITAL SIGNS:  Blood pressure (!) 167/107, pulse 75, temperature 98 F (36.7 C), temperature source Rectal, resp. rate 16, height 5\' 5"  (1.651 m), weight 63.5 kg, SpO2 100 %.  PHYSICAL EXAMINATION:    Physical Exam  GENERAL:  79 y.o.-year-old Caucasian female patient lying in the bed with mild to moderate respiratory distress on BiPAP.   EYES: Pupils equal, round, reactive to light and accommodation. No scleral icterus. Extraocular muscles intact.  HEENT: Head atraumatic, normocephalic. Oropharynx and nasopharynx clear.  NECK:  Supple, no jugular venous distention. No thyroid enlargement, no tenderness.  LUNGS: Diminished bibasal breath sounds with bibasal rales. CARDIOVASCULAR: Regular rate and rhythm, S1, S2 normal. No murmurs, rubs, or gallops.  ABDOMEN: Soft, nondistended, nontender. Bowel sounds present. No organomegaly or mass.  EXTREMITIES: 1-2+ bilateral lower extremity pitting edema, with  no cyanosis, or clubbing.  NEUROLOGIC: Cranial nerves II through XII are intact. Muscle strength 5/5 in all extremities. Sensation intact. Gait not checked.  PSYCHIATRIC: The patient is alert and oriented x 3.  Normal affect and good eye contact. SKIN: No obvious rash, lesion, or ulcer.   LABORATORY PANEL:   CBC Recent Labs  Lab 02-17-2020 1759  WBC 13.8*  HGB 10.6*  HCT 33.6*  PLT 126*   ------------------------------------------------------------------------------------------------------------------  Chemistries  Recent Labs  Lab 02-17-20 1759  NA 135  K 4.3  CL 102  CO2 20*  GLUCOSE 119*  BUN 67*  CREATININE 4.35*  CALCIUM 8.2*  AST 160*  ALT 108*  ALKPHOS 95  BILITOT 1.3*   ------------------------------------------------------------------------------------------------------------------  Cardiac Enzymes No results for input(s): TROPONINI in the last 168 hours. ------------------------------------------------------------------------------------------------------------------  RADIOLOGY:  DG Chest Port 1 View  Result Date: 17-Feb-2020 CLINICAL DATA:  Acute shortness of breath and fever. EXAM: PORTABLE CHEST 1 VIEW COMPARISON:  06/03/2016 FINDINGS: Cardiomegaly noted  with bilateral interstitial and mild airspace opacities and trace bilateral pleural effusions. No pneumothorax. No acute bony abnormalities are present. IMPRESSION: Cardiomegaly with bilateral interstitial and mild airspace opacities and trace bilateral pleural effusions. Favor edema over infection. Electronically Signed   By: Harmon Pier M.D.   On: 17-Feb-2020 18:57      IMPRESSION AND PLAN:   1.  Acute pulmonary edema, likely secondary to new onset acute CHF probably diastolic with subsequent acute respiratory failure with hypoxia requiring BiPAP. -The patient will be admitted to a stepdown unit bed. -We will continue diuresis with IV Lasix. -We will follow serial troponin I's. -We will obtain a 2D echo and a cardiology consultation. -I notified Dr. Darrold Junker about the patient.  2.  Acute kidney injury superimposed on stage IIIb chronic kidney disease. -  This is likely secondary to acute CHF with subsequent renal hypoperfusion. -We will follow BMPs with diuresis. -Renal ultrasound was ordered. -Nephrology consultation will be obtained. -Dr. Wolfgang Phoenix was notified about the patient.  3.  Hypertensive urgency. -This likely contributing to #1. -Strict blood pressure control will be sought. -We will continue the patient's Lopressor and hold Cozaar given her acute kidney injury. -We will place her on as needed IV labetalol and hydralazine.  4.  Elevated lactic acid. -I do not believe that she has severe sepsis. -She will still covered with IV antibiotics and blood cultures and will follow blood cultures and sputum culture given her severe respiratory distress.  4.  Dyslipidemia. -The patient will be continued on statin therapy.  5.  Coronary artery disease. -We will continue beta-blocker therapy as well as Plavix and Imdur.  6.  Depression. -Wellbutrin XL will be continued.  7.  GERD . -PPI therapy will be resumed.  8.  DVT prophylaxis. -Subcutaneous Lovenox.   All the  records are reviewed and case discussed with ED provider. The plan of care was discussed in details with the patient and her son Thereasa Distance over the phone. I answered all questions. The patient and her son agreed to proceed with the above mentioned plan. Further management will depend upon hospital course.   CODE STATUS: Full code  Status is: Inpatient  Remains inpatient appropriate because:Hemodynamically unstable, Altered mental status, Ongoing diagnostic testing needed not appropriate for outpatient work up, Unsafe d/c plan, IV treatments appropriate due to intensity of illness or inability to take PO and Inpatient level of care appropriate due to severity of illness   Dispo: The patient is from: Home  Anticipated d/c is to: Home              Anticipated d/c date is: 2 days              Patient currently is not medically stable to d/c.       TOTAL TIME TAKING CARE OF THIS PATIENT: 60 minutes.    Hannah Beat M.D on 01/24/2020 at 8:19 PM  [facility]   From 7 PM-7 AM, contact night-coverage [email or web address]  CC: Primary care physician; Maple Hudson., MD   Note: This dictation was prepared with Dragon dictation along with smaller phrase technology. Any transcriptional errors that result from this process are unintentional.

## 2020-02-06 NOTE — ED Notes (Signed)
Please call sons with update:  Barth Kirks: [phone] Jule Ser: [phone]

## 2020-02-06 NOTE — ED Notes (Signed)
External catheter placed, pt appears dry- no urine noted in the bed.

## 2020-02-06 NOTE — ED Notes (Signed)
Pt is alert at this time, oriented x4. Pt updated on condition and plan of care. Pt verbalizes understanding.

## 2020-02-07 ENCOUNTER — Telehealth: Payer: Self-pay

## 2020-02-07 DIAGNOSIS — J9601 Acute respiratory failure with hypoxia: Secondary | ICD-10-CM | POA: Diagnosis not present

## 2020-02-07 LAB — BLOOD GAS, ARTERIAL
Acid-base deficit: 13.5 mmol/L — ABNORMAL HIGH (ref 0.0–2.0)
Bicarbonate: 13.8 mmol/L — ABNORMAL LOW (ref 20.0–28.0)
Delivery systems: POSITIVE
Expiratory PAP: 5
FIO2: 0.7
Inspiratory PAP: 12
Mechanical Rate: 16
O2 Saturation: 95.2 %
Patient temperature: 37
RATE: 16 resp/min
pCO2 arterial: 36 mmHg (ref 32.0–48.0)
pH, Arterial: 7.19 — CL (ref 7.350–7.450)
pO2, Arterial: 94 mmHg (ref 83.0–108.0)

## 2020-02-07 LAB — BASIC METABOLIC PANEL
Anion gap: 13 (ref 5–15)
BUN: 66 mg/dL — ABNORMAL HIGH (ref 8–23)
CO2: 23 mmol/L (ref 22–32)
Calcium: 8.1 mg/dL — ABNORMAL LOW (ref 8.9–10.3)
Chloride: 105 mmol/L (ref 98–111)
Creatinine, Ser: 4.77 mg/dL — ABNORMAL HIGH (ref 0.44–1.00)
GFR calc Af Amer: 9 mL/min — ABNORMAL LOW (ref >60)
GFR calc non Af Amer: 8 mL/min — ABNORMAL LOW (ref >60)
Glucose, Bld: 110 mg/dL — ABNORMAL HIGH (ref 70–99)
Potassium: 4.5 mmol/L (ref 3.5–5.1)
Sodium: 141 mmol/L (ref 135–145)

## 2020-02-07 LAB — CBC WITH DIFFERENTIAL/PLATELET
Abs Immature Granulocytes: 0.07 10*3/uL (ref 0.00–0.07)
Basophils Absolute: 0 10*3/uL (ref 0.0–0.1)
Basophils Relative: 0 %
Eosinophils Absolute: 0 10*3/uL (ref 0.0–0.5)
Eosinophils Relative: 0 %
HCT: 34.6 % — ABNORMAL LOW (ref 36.0–46.0)
Hemoglobin: 10.9 g/dL — ABNORMAL LOW (ref 12.0–15.0)
Immature Granulocytes: 0 %
Lymphocytes Relative: 2 %
Lymphs Abs: 0.3 10*3/uL — ABNORMAL LOW (ref 0.7–4.0)
MCH: 27.3 pg (ref 26.0–34.0)
MCHC: 31.5 g/dL (ref 30.0–36.0)
MCV: 86.7 fL (ref 80.0–100.0)
Monocytes Absolute: 0.9 10*3/uL (ref 0.1–1.0)
Monocytes Relative: 5 %
Neutro Abs: 16.4 10*3/uL — ABNORMAL HIGH (ref 1.7–7.7)
Neutrophils Relative %: 93 %
Platelets: 97 10*3/uL — ABNORMAL LOW (ref 150–400)
RBC: 3.99 MIL/uL (ref 3.87–5.11)
RDW: 17.5 % — ABNORMAL HIGH (ref 11.5–15.5)
WBC: 17.7 10*3/uL — ABNORMAL HIGH (ref 4.0–10.5)
nRBC: 2.4 % — ABNORMAL HIGH (ref 0.0–0.2)

## 2020-02-07 LAB — TROPONIN I (HIGH SENSITIVITY)
Troponin I (High Sensitivity): 233 ng/L (ref <18)
Troponin I (High Sensitivity): 257 ng/L (ref <18)

## 2020-02-07 LAB — PROCALCITONIN: Procalcitonin: 0.24 ng/mL

## 2020-02-07 LAB — URINE CULTURE: Culture: NO GROWTH

## 2020-02-07 LAB — BRAIN NATRIURETIC PEPTIDE: B Natriuretic Peptide: >4500 pg/mL — ABNORMAL HIGH (ref 0.0–100.0)

## 2020-02-07 LAB — LACTIC ACID, PLASMA: Lactic Acid, Venous: 4.4 mmol/L (ref 0.5–1.9)

## 2020-02-07 MED ORDER — SODIUM BICARBONATE 8.4 % IV SOLN
50.0000 meq | Freq: Once | INTRAVENOUS | Status: AC
  Administered 2020-02-07: 02:00:00 50 meq via INTRAVENOUS
  Filled 2020-02-07: qty 50

## 2020-02-07 MED ORDER — SODIUM BICARBONATE 8.4 % IV SOLN
50.0000 meq | Freq: Once | INTRAVENOUS | Status: AC
  Administered 2020-02-07: 06:00:00 50 meq via INTRAVENOUS
  Filled 2020-02-07: qty 50

## 2020-02-07 MED ORDER — SODIUM BICARBONATE 8.4 % IV SOLN
50.0000 meq | Freq: Once | INTRAVENOUS | Status: AC
  Administered 2020-02-07: 04:00:00 50 meq via INTRAVENOUS
  Filled 2020-02-07: qty 50

## 2020-02-07 MED ORDER — MORPHINE SULFATE (PF) 2 MG/ML IV SOLN
0.5000 mg | Freq: Once | INTRAVENOUS | Status: AC
  Administered 2020-02-07: 0.5 mg via INTRAVENOUS
  Filled 2020-02-07: qty 1

## 2020-02-11 LAB — CULTURE, BLOOD (ROUTINE X 2)
Culture: NO GROWTH
Culture: NO GROWTH
Special Requests: ADEQUATE
Special Requests: ADEQUATE

## 2020-02-12 NOTE — Death Summary Note (Signed)
DEATH SUMMARY   Patient Details  Name: Brittany Acevedo MRN: [id number] DOB: Dec 31, 1940  Admission/Discharge Information   Admit Date:  Feb 23, 2020  Date of Death: Date of Death: 2020-02-24  Time of Death: Time of Death: 0916  Length of Stay: 1  Referring Physician: Maple Hudson., MD   Reason(s) for Hospitalization  ISCHEMIC CARDIOMYOPATHY  Diagnoses  Preliminary cause of death: ISCHEMIC CARDIOMYOPATHY Secondary Diagnoses (including complications and co-morbidities):  Active Problems:   Acute respiratory failure [facility])   Brief Hospital Course (including significant findings, care, treatment, and services provided and events leading to death)  79 y.o. Female admitted 02-23-2023 with Acute Hypoxic Respiratory Failure secondary to new onset CHF requiring BiPAP, along with AKI superimposed on CKD III.  SIGNIFICANT EVENTS  02-23-23: Admission to Stepdown Feb 24, 2023: PCCM consulted  STUDIES:  2023-02-24: CXR>>Cardiomegaly with bilateral interstitial and mild airspace opacities and trace bilateral pleural effusions. Favor edema over infection. 24-Feb-2023: Renal US>>1. Bilateral renal cysts. 2. Bilateral echogenic kidneys likely secondary to medical renal disease. 3. Left pleural effusion  CULTURES: SARS-CoV-2 PCR 4/25>> negative Influenza PCR 4/25>> negative Blood culture x2 4/25>> Urine 4/25>> Strep pneumo urinary antigen 4/25>> Legionella urinary antigen 4/25>> MRSA PCR 4/25>> negative  ANTIBIOTICS: Azithromycin 2023-02-23>> Ceftriaxone 4/25>> Zosyn x1 dose in ED 23-Feb-2023 Vancomycin x1 dose in ED Feb 23, 2023  clinical status relayed to family  Updated and notified of patients medical condition-  Progressive multiorgan failure with very low chance of meaningful recovery.  Patient is in dying  Process.  Family understands the situation.  They have consented and agreed to DNR/DNI   Family are satisfied with Plan of action and management. All questions answered      Pertinent Labs and Studies   Significant Diagnostic Studies US Renal  Result Date: 02/23/2020 CLINICAL DATA:  Acute renal failure. EXAM: RENAL / URINARY TRACT ULTRASOUND COMPLETE COMPARISON:  None. FINDINGS: Right Kidney: Renal measurements: 10.1 cm x 5.3 cm x 4.3 cm = volume: 120.3 mL. There is diffusely increased echogenicity of the renal parenchyma. A 5.5 cm x 3.6 cm x 3.5 cm anechoic structure is seen within the lower pole of the right kidney. No hydronephrosis visualized. Left Kidney: Renal measurements: 9.6 cm x 4.6 cm x 4.4 cm = volume: 100.9 mL. There is diffusely increased echogenicity of the renal parenchyma. A 3.4 cm x 4.6 cm x 3.1 cm anechoic structure is seen within the upper pole of the left kidney. No hydronephrosis visualized. Bladder: Appears normal for degree of bladder distention. Other: Of incidental in note is the presence of a left pleural effusion. IMPRESSION: 1. Bilateral renal cysts. 2. Bilateral echogenic kidneys likely secondary to medical renal disease. 3. Left pleural effusion. Electronically Signed   By: Aram Candela M.D.   On: 02/23/20 20:35   DG Chest Port 1 View  Result Date: 02/23/20 CLINICAL DATA:  Acute shortness of breath and fever. EXAM: PORTABLE CHEST 1 VIEW COMPARISON:  06/03/2016 FINDINGS: Cardiomegaly noted with bilateral interstitial and mild airspace opacities and trace bilateral pleural effusions. No pneumothorax. No acute bony abnormalities are present. IMPRESSION: Cardiomegaly with bilateral interstitial and mild airspace opacities and trace bilateral pleural effusions. Favor edema over infection. Electronically Signed   By: Harmon Pier M.D.   On: 2020-02-23 18:57    Microbiology Recent Results (from the past 240 hour(s))  Blood Culture (routine x 2)     Status: None (Preliminary result)   Collection Time: 02/23/2020  5:59 PM   Specimen: BLOOD  Result Value Ref Range  Status   Specimen Description BLOOD RIGHT AC  Final   Special Requests   Final    BOTTLES DRAWN AEROBIC  AND ANAEROBIC Blood Culture adequate volume   Culture   Final    NO GROWTH < 12 HOURS Performed at [facility], [address] [place], [place] [address]    Report Status PENDING  Incomplete  Blood Culture (routine x 2)     Status: None (Preliminary result)   Collection Time: 01/29/2020  5:59 PM   Specimen: BLOOD  Result Value Ref Range Status   Specimen Description BLOOD LEFT AC  Final   Special Requests   Final    BOTTLES DRAWN AEROBIC AND ANAEROBIC Blood Culture adequate volume   Culture   Final    NO GROWTH < 12 HOURS Performed at [facility], [address], [place], [place] [address]    Report Status PENDING  Incomplete  Respiratory Panel by RT PCR (Flu A&B, Covid) - Nasopharyngeal Swab     Status: None   Collection Time: 02/04/2020  6:06 PM   Specimen: Nasopharyngeal Swab  Result Value Ref Range Status   SARS Coronavirus 2 by RT PCR NEGATIVE NEGATIVE Final    Comment: (NOTE) SARS-CoV-2 target nucleic acids are NOT DETECTED. The SARS-CoV-2 RNA is generally detectable in upper respiratoy specimens during the acute phase of infection. The lowest concentration of SARS-CoV-2 viral copies this assay can detect is 131 copies/mL. A negative result does not preclude SARS-Cov-2 infection and should not be used as the sole basis for treatment or other patient management decisions. A negative result may occur with  improper specimen collection/handling, submission of specimen other than nasopharyngeal swab, presence of viral mutation(s) within the areas targeted by this assay, and inadequate number of viral copies (<131 copies/mL). A negative result must be combined with clinical observations, patient history, and epidemiological information. The expected result is Negative. Fact Sheet for Patients:  PinkCheek.be Fact Sheet for Healthcare Providers:  GravelBags.it This test is not yet ap proved or cleared  by the [place] FDA and  has been authorized for detection and/or diagnosis of SARS-CoV-2 by FDA under an Emergency Use Authorization (EUA). This EUA will remain  in effect (meaning this test can be used) for the duration of the COVID-19 declaration under Section 564(b)(1) of the Act, 21 U.S.C. section 360bbb-3(b)(1), unless the authorization is terminated or revoked sooner.    Influenza A by PCR NEGATIVE NEGATIVE Final   Influenza B by PCR NEGATIVE NEGATIVE Final    Comment: (NOTE) The Xpert Xpress SARS-CoV-2/FLU/RSV assay is intended as an aid in  the diagnosis of influenza from Nasopharyngeal swab specimens and  should not be used as a sole basis for treatment. Nasal washings and  aspirates are unacceptable for Xpert Xpress SARS-CoV-2/FLU/RSV  testing. Fact Sheet for Patients: PinkCheek.be Fact Sheet for Healthcare Providers: GravelBags.it This test is not yet approved or cleared by the [place] FDA and  has been authorized for detection and/or diagnosis of SARS-CoV-2 by  FDA under an Emergency Use Authorization (EUA). This EUA will remain  in effect (meaning this test can be used) for the duration of the  Covid-19 declaration under Section 564(b)(1) of the Act, 21  U.S.C. section 360bbb-3(b)(1), unless the authorization is  terminated or revoked. Performed at [facility], [address], [place], [place] [address]   MRSA PCR Screening     Status: None   Collection Time: 01/15/2020  9:55 PM   Specimen: Nasal  Mucosa; Nasopharyngeal  Result Value Ref Range Status   MRSA by PCR NEGATIVE NEGATIVE Final    Comment:        The GeneXpert MRSA Assay (FDA approved for NASAL specimens only), is one component of a comprehensive MRSA colonization surveillance program. It is not intended to diagnose MRSA infection nor to guide or monitor treatment for MRSA infections. Performed at [facility],  [address] [place], [place] [address]     Lab Basic Metabolic Panel: Recent Labs  Lab Feb 14, 2020 1759 01/25/2020 0549  NA 135 141  K 4.3 4.5  CL 102 105  CO2 20* 23  GLUCOSE 119* 110*  BUN 67* 66*  CREATININE 4.35* 4.77*  CALCIUM 8.2* 8.1*   Liver Function Tests: Recent Labs  Lab 2020-02-14 1759  AST 160*  ALT 108*  ALKPHOS 95  BILITOT 1.3*  PROT 6.0*  ALBUMIN 2.0*   No results for input(s): LIPASE, AMYLASE in the last 168 hours. No results for input(s): AMMONIA in the last 168 hours. CBC: Recent Labs  Lab 02-14-20 1759 01/30/2020 0455  WBC 13.8* 17.7*  NEUTROABS 11.7* 16.4*  HGB 10.6* 10.9*  HCT 33.6* 34.6*  MCV 84.8 86.7  PLT 126* 97*   Cardiac Enzymes: No results for input(s): CKTOTAL, CKMB, CKMBINDEX, TROPONINI in the last 168 hours. Sepsis Labs: Recent Labs  Lab Feb 14, 2020 1759 02/14/2020 2253 01/29/2020 0441 02/03/2020 0455 02/09/2020 0549  PROCALCITON  --   --   --   --  0.24  WBC 13.8*  --   --  17.7*  --   LATICACIDVEN 4.3* 5.1* 4.4*  --   --      Lem Peary 01/16/2020, 9:56 AM

## 2020-02-12 NOTE — Consult Note (Signed)
CARDIOLOGY CONSULT NOTE               Patient ID: ARNOLD DEPINTO MRN: [id number] DOB/AGE: Apr 11, 1941 79 y.o.  Admit date: 01/21/2020 Referring Physician Mansy Primary Physician Sullivan Lone Primary Cardiologist Paraschos Reason for Consultation acute CHF  HPI: 79 year old female referred for acute CHF. The patient has a history of coronary artery disease, status post inferior STEMI with overlapping stents proximal RCA in 2012, patent stents per cardiac catheterization in 2013, stage III chronic kidney disease hyperlipidemia, hypertension, COPD, tobacco abuse, and dementia.  Per the patient's son, the patient was noted to have progressive shortness of breath over the course of several days, then experienced acute worsening, and was taken to [facility] ER via [facility]. She was noted to be in acute respiratory failure with O2 saturations in the 80s, altered mental status, and mottled extremities.  Chest x-ray revealed cardiomegaly with bilateral interstitial and mild airspace opacities and trace bilateral pleural effusions.  EKG revealed sinus rhythm at a rate of 80 bpm with right bundle branch block, similar to previous ECG in 09/2019.  Admission labs notable for high-sensitivity troponin 233 followed by 257, BNP greater than 4500, WBC 13.8, creatinine 4.35, BUN 67, lactic acid 4.3, AST 160, ALT 108.  She was placed on BiPAP with improvement in her symptoms, antibiotics, IV Solu-Medrol, IV Lasix. Lexiscan Myoview in 01/2018 revealed normal ventricular function, no wall motion abnormality, and no evidence of scar or ischemia.  ROS unable to be determined due to patient's altered mental status.  Past Medical History:  Diagnosis Date  . Anemia   . Asthma   . Broken ankle   . Emphysema lung (HCC)   . Fibromyalgia   . GERD (gastroesophageal reflux disease)   . Heart attack (HCC) 2013  . Hemorrhoids   . Hypertension   . Peripheral nervous system disorder   . Shortness of breath dyspnea   . Sinus trouble     . Wears dentures    full upper    Past Surgical History:  Procedure Laterality Date  . ABDOMINAL HYSTERECTOMY     w/ removal of ovarian cyst  . ANGIOPLASTY  2013  . APPENDECTOMY    . cyst removed from face  2008  . PARTIAL HYSTERECTOMY  1980  . TUBAL LIGATION  1970    Medications Prior to Admission  Medication Sig Dispense Refill Last Dose  . atorvastatin (LIPITOR) 10 MG tablet Take 1 tablet (10 mg total) by mouth daily. 90 tablet 0 unknown at unknown  . buPROPion (WELLBUTRIN SR) 150 MG 12 hr tablet Take 1 tablet (150 mg total) by mouth 2 (two) times daily. (Patient taking differently: Take 150 mg by mouth daily. ) 60 tablet 0 01/17/2020 at 0930  . clopidogrel (PLAVIX) 75 MG tablet 1 Tablet, Oral, daily, by mouth (Patient taking differently: Take 75 mg by mouth daily. ) 90 tablet 3 02/11/2020 at 0930  . donepezil (ARICEPT) 10 MG tablet Take 1 tablet (10 mg total) by mouth at bedtime. 30 tablet 11 unknown at unknown  . isosorbide mononitrate (IMDUR) 30 MG 24 hr tablet Take 1 tablet (30 mg total) by mouth daily. (Patient taking differently: Take 30 mg by mouth daily. ) 90 tablet 3 02/05/2020 at 0930  . losartan (COZAAR) 50 MG tablet Take 1 tablet (50 mg total) by mouth daily. 90 tablet 3 02/05/2020 at unknown  . metoprolol tartrate (LOPRESSOR) 25 MG tablet Take 0.5 tablets (12.5 mg total) by mouth 2 (two) times daily. (Patient taking  differently: Take 12.5 mg by mouth 2 (two) times daily. ) 90 tablet 1 01/22/2020 at 0930  . nitroGLYCERIN (NITROSTAT) 0.4 MG SL tablet Place 1 tablet (0.4 mg total) under the tongue every 5 (five) minutes as needed for chest pain. 25 tablet 11 prn at prn  . pantoprazole (PROTONIX) 40 MG tablet Take 1 tablet (40 mg total) by mouth daily. 90 tablet 0 02/03/2020 at 0930  . traMADol (ULTRAM) 50 MG tablet TAKE (1) TABLET EVERY SIX HOURS AS NEEDED. 100 tablet 4 prn at prn   Social History   Socioeconomic History  . Marital status: Widowed    Spouse name: Not on file   . Number of children: 5  . Years of education: Not on file  . Highest education level: 11th grade  Occupational History  . Occupation: retired  Tobacco Use  . Smoking status: Current Every Day Smoker    Packs/day: 1.50    Years: 55.00    Pack years: 82.50    Types: Cigarettes  . Smokeless tobacco: Never Used  Substance and Sexual Activity  . Alcohol use: No  . Drug use: No  . Sexual activity: Not on file  Other Topics Concern  . Not on file  Social History Narrative  . Not on file   Social Determinants of Health   Financial Resource Strain:   . Difficulty of Paying Living Expenses:   Food Insecurity:   . Worried About Programme researcher, broadcasting/film/video in the Last Year:   . Barista in the Last Year:   Transportation Needs:   . Freight forwarder (Medical):   Marland Kitchen Lack of Transportation (Non-Medical):   Physical Activity:   . Days of Exercise per Week:   . Minutes of Exercise per Session:   Stress:   . Feeling of Stress :   Social Connections:   . Frequency of Communication with Friends and Family:   . Frequency of Social Gatherings with Friends and Family:   . Attends Religious Services:   . Active Member of Clubs or Organizations:   . Attends Banker Meetings:   Marland Kitchen Marital Status:   Intimate Partner Violence:   . Fear of Current or Ex-Partner:   . Emotionally Abused:   Marland Kitchen Physically Abused:   . Sexually Abused:     Family History  Problem Relation Age of Onset  . Asthma Sister   . Diabetes Sister   . Heart disease Father   . Rheum arthritis Father   . Clotting disorder Sister   . Rheum arthritis Brother   . Rheum arthritis Sister   . Emphysema Cousin   . Asthma Other   . Heart disease Maternal Uncle   . Heart disease Maternal Grandmother   . Rheum arthritis Son   . Cancer Other            PHYSICAL EXAM  General: critically ill appearing, lying in bed on BiPAP, unresponsive to verbal stimulus HEENT:  Normocephalic and atramatic Neck:  JVD present Lungs: on BiPAP Heart: HRRR . Normal S1 and S2 without gallops or murmurs.  Abdomen: nondistended  Msk:  No obvious deformities Extremities: No edema.   Neuro: not alert or oriented, unresponsive to verbal stimuli   Labs:   Lab Results  Component Value Date   WBC 17.7 (H) 2020-02-09   HGB 10.9 (L) 02-09-20   HCT 34.6 (L) 02/09/20   MCV 86.7 Feb 09, 2020   PLT 97 (L) 02-09-20    Recent Labs  Lab 01/14/2020 1759 01/20/2020 1759 03/06/20 0549  NA 135   < > 141  K 4.3   < > 4.5  CL 102   < > 105  CO2 20*   < > 23  BUN 67*   < > 66*  CREATININE 4.35*   < > 4.77*  CALCIUM 8.2*   < > 8.1*  PROT 6.0*  --   --   BILITOT 1.3*  --   --   ALKPHOS 95  --   --   ALT 108*  --   --   AST 160*  --   --   GLUCOSE 119*   < > 110*   < > = values in this interval not displayed.   Lab Results  Component Value Date   CKTOTAL 287 (H) 01/07/2018   TROPONINI < 0.02 03/20/2014    Lab Results  Component Value Date   CHOL 205 (H) 09/04/2018   CHOL 243 (H) 05/20/2016   CHOL 209 (H) 05/11/2015   Lab Results  Component Value Date   HDL 33 (L) 09/04/2018   HDL 38 (L) 05/20/2016   HDL 41 05/11/2015   Lab Results  Component Value Date   LDLCALC 139 (H) 09/04/2018   LDLCALC 183 (H) 05/20/2016   LDLCALC 151 (H) 05/11/2015   Lab Results  Component Value Date   TRIG 163 (H) 09/04/2018   TRIG 111 05/20/2016   TRIG 84 05/11/2015   Lab Results  Component Value Date   CHOLHDL 6.2 (H) 09/04/2018   CHOLHDL 6.4 (H) 05/20/2016   CHOLHDL 5.1 (H) 05/11/2015   No results found for: LDLDIRECT    Radiology: US Renal  Result Date: 02/05/2020 CLINICAL DATA:  Acute renal failure. EXAM: RENAL / URINARY TRACT ULTRASOUND COMPLETE COMPARISON:  None. FINDINGS: Right Kidney: Renal measurements: 10.1 cm x 5.3 cm x 4.3 cm = volume: 120.3 mL. There is diffusely increased echogenicity of the renal parenchyma. A 5.5 cm x 3.6 cm x 3.5 cm anechoic structure is seen within the lower pole of the  right kidney. No hydronephrosis visualized. Left Kidney: Renal measurements: 9.6 cm x 4.6 cm x 4.4 cm = volume: 100.9 mL. There is diffusely increased echogenicity of the renal parenchyma. A 3.4 cm x 4.6 cm x 3.1 cm anechoic structure is seen within the upper pole of the left kidney. No hydronephrosis visualized. Bladder: Appears normal for degree of bladder distention. Other: Of incidental in note is the presence of a left pleural effusion. IMPRESSION: 1. Bilateral renal cysts. 2. Bilateral echogenic kidneys likely secondary to medical renal disease. 3. Left pleural effusion. Electronically Signed   By: Aram Candela M.D.   On: 02/08/2020 20:35   DG Chest Port 1 View  Result Date: 01/15/2020 CLINICAL DATA:  Acute shortness of breath and fever. EXAM: PORTABLE CHEST 1 VIEW COMPARISON:  06/03/2016 FINDINGS: Cardiomegaly noted with bilateral interstitial and mild airspace opacities and trace bilateral pleural effusions. No pneumothorax. No acute bony abnormalities are present. IMPRESSION: Cardiomegaly with bilateral interstitial and mild airspace opacities and trace bilateral pleural effusions. Favor edema over infection. Electronically Signed   By: Harmon Pier M.D.   On: 02/09/2020 18:57    EKG: sinus rhythm, RBBB  ASSESSMENT AND PLAN:  1.  Acute hypoxic respiratory failure secondary to acute CHF and hypertensive urgency, with AKI on CKD stage III.  The patient received IV Lasix and placed on BiPAP. 2. Severe sepsis 3. AKI on CKD stage III, creatinine 4.77, BUN 66 4.  COPD, on BiPAP, IV Solu Medrol 5. Coronary artery disease status post inferior STEMI and stents RCA in 2012, with patent stents per cath in 2013.   Recommendations: 1. Agree with current therapy 2. Consider 2D echocardiogram 3. Defer cardiac catheterization in the setting of AKI on CKD, dementia, ECG without evidence of acute ischemia, and patient currently in dying process  4. Continue aspirin and Plavix 5. Continue furosemide,  losartan, and metoprolol as blood pressure tolerates  Signed: Clabe Seal PA-C 02/10/2020, 8:15 AM

## 2020-02-12 NOTE — Consult Note (Signed)
Name: Brittany Acevedo MRN: [id number] DOB: 09-11-1941    ADMISSION DATE:  01/20/2020 CONSULTATION DATE:  2020/03/03  REFERRING MD :  Dr. Arville Care  CHIEF COMPLAINT:  Shortness of breath  BRIEF PATIENT DESCRIPTION:  79 y.o. Female admitted 4/25 with Acute Hypoxic Respiratory Failure secondary to new onset CHF requiring BiPAP, along with AKI superimposed on CKD III.  SIGNIFICANT EVENTS  4/25: Admission to Stepdown 4/26: PCCM consulted  STUDIES:  4/26: CXR>>Cardiomegaly with bilateral interstitial and mild airspace opacities and trace bilateral pleural effusions. Favor edema over infection. 4/26: Renal US>>1. Bilateral renal cysts. 2. Bilateral echogenic kidneys likely secondary to medical renal disease. 3. Left pleural effusion  CULTURES: SARS-CoV-2 PCR 4/25>> negative Influenza PCR 4/25>> negative Blood culture x2 4/25>> Urine 4/25>> Strep pneumo urinary antigen 4/25>> Legionella urinary antigen 4/25>> MRSA PCR 4/25>> negative  ANTIBIOTICS: Azithromycin 4/25>> Ceftriaxone 4/25>> Zosyn x1 dose in ED 4/25 Vancomycin x1 dose in ED 4/25  HISTORY OF PRESENT ILLNESS:   Brittany Acevedo is a 79 year old female with a past medical history significant for asthma, COPD, CAD, hypertension, GERD, fibromyalgia who presented to [facility] ED on 01/27/2020 with acute onset of respiratory distress.  Upon EMS arrival she was found to be hypoxic with O2 saturations of 80%.  Upon arrival to the ED she was very lethargic and altered, with mottled extremities.  Blood pressure was 165/110, heart rate 80, respiratory rate 27.  She was subsequently placed on BiPAP with improvement of O2 saturations to 96%.  Initial work-up in the ED revealed BUN 67, creatinine 4.35, bicarb 29, albumin 2, AST 160, ALT 108, BNP greater than 4500, lactic acid 4.3, WBC 13.8 with neutrophilia.  Urinalysis with more than 300 protein and specific gravity more than 1030, no urinary tract infection.  EKG with normal sinus rhythm and  possible atypical right bundle branch block.  Chest x-ray showed cardiomegaly with bilateral bases and trace bilateral effusions concerning for edema.  She was given 40 mg IV Lasix, 125 mg Solu-Medrol, duo nebs, IV Zosyn and IV vancomycin.  She was admitted to the stepdown unit by the hospitalist for further work-up and treatment of acute hypoxic respiratory failure secondary to new onset CHF requiring BiPAP, along with AKI superimposed on CKD Stage III.  Shortly after arrival to [facility], noted to have continued moderate respiratory distress on BiPAP.  PCCM consulted for further management.  PAST MEDICAL HISTORY :   has a past medical history of Anemia, Asthma, Broken ankle, Emphysema lung (HCC), Fibromyalgia, GERD (gastroesophageal reflux disease), Heart attack (HCC) (2013), Hemorrhoids, Hypertension, Peripheral nervous system disorder, Shortness of breath dyspnea, Sinus trouble, and Wears dentures.  has a past surgical history that includes Angioplasty (2013); Partial hysterectomy (1980); Tubal ligation (1970); cyst removed from face (2008); Appendectomy; and Abdominal hysterectomy. Prior to Admission medications   Medication Sig Start Date End Date Taking? Authorizing Provider  atorvastatin (LIPITOR) 10 MG tablet Take 1 tablet (10 mg total) by mouth daily. 10/14/19  Yes Maple Hudson., MD  buPROPion [facility] SR) 150 MG 12 hr tablet Take 1 tablet (150 mg total) by mouth 2 (two) times daily. Patient taking differently: Take 150 mg by mouth daily.  12/16/19  Yes Maple Hudson., MD  clopidogrel (PLAVIX) 75 MG tablet 1 Tablet, Oral, daily, by mouth Patient taking differently: Take 75 mg by mouth daily.  12/02/18  Yes Maple Hudson., MD  donepezil (ARICEPT) 10 MG tablet Take 1 tablet (10 mg total) by mouth at bedtime. 06/23/19  Yes Maple Hudson., MD  isosorbide mononitrate (IMDUR) 30 MG 24 hr tablet Take 1 tablet (30 mg total) by mouth daily. Patient taking differently:  Take 30 mg by mouth daily.  11/02/18  Yes Maple Hudson., MD  losartan (COZAAR) 50 MG tablet Take 1 tablet (50 mg total) by mouth daily. 01/11/20  Yes Maple Hudson., MD  metoprolol tartrate (LOPRESSOR) 25 MG tablet Take 0.5 tablets (12.5 mg total) by mouth 2 (two) times daily. Patient taking differently: Take 12.5 mg by mouth 2 (two) times daily.  07/20/19  Yes Maple Hudson., MD  nitroGLYCERIN (NITROSTAT) 0.4 MG SL tablet Place 1 tablet (0.4 mg total) under the tongue every 5 (five) minutes as needed for chest pain. 01/07/18  Yes Paraschos, Alexander, MD  pantoprazole (PROTONIX) 40 MG tablet Take 1 tablet (40 mg total) by mouth daily. 10/18/19  Yes Maple Hudson., MD  traMADol (ULTRAM) 50 MG tablet TAKE (1) TABLET EVERY SIX HOURS AS NEEDED. 01/11/20  Yes Maple Hudson., MD   Allergies  Allergen Reactions  . Gabapentin Anaphylaxis    Throat closes  . Darvon [Propoxyphene]     hives  . Shellfish Allergy     "causes problems with my fibromyalgia"    FAMILY HISTORY:  family history includes Asthma in her sister and another family member; Cancer in an other family member; Clotting disorder in her sister; Diabetes in her sister; Emphysema in her cousin; Heart disease in her father, maternal grandmother, and maternal uncle; Rheum arthritis in her brother, father, sister, and son. SOCIAL HISTORY:  reports that she has been smoking cigarettes. She has a 82.50 pack-year smoking history. She has never used smokeless tobacco. She reports that she does not drink alcohol or use drugs.   COVID-19 DISASTER DECLARATION:  FULL CONTACT PHYSICAL EXAMINATION WAS NOT POSSIBLE DUE TO TREATMENT OF COVID-19 AND  CONSERVATION OF PERSONAL PROTECTIVE EQUIPMENT, LIMITED EXAM FINDINGS INCLUDE-  Patient assessed or the symptoms described in the history of present illness.  In the context of the Global COVID-19 pandemic, which necessitated consideration that the patient might be at  risk for infection with the SARS-CoV-2 virus that causes COVID-19, Institutional protocols and algorithms that pertain to the evaluation of patients at risk for COVID-19 are in a state of rapid change based on information released by regulatory bodies including the CDC and federal and state organizations. These policies and algorithms were followed during the patient's care while in hospital.  REVIEW OF SYSTEMS:   Unable to assess due to altered mental status and respiratory distress  SUBJECTIVE:  Unable to assess due to altered mental status and respiratory distress  VITAL SIGNS: Temp:  [97.3 F (36.3 C)-98 F (36.7 C)] 97.3 F (36.3 C) (04/25 2149) Pulse Rate:  [58-114] 73 (04/25 2149) Resp:  [14-34] 34 (04/26 0000) BP: (132-177)/(26-150) 132/120 (04/26 0000) SpO2:  [91 %-100 %] 91 % (04/25 2149) FiO2 (%):  [80 %] 80 % (04/25 2149) Weight:  [52.8 kg-63.5 kg] 52.8 kg (04/25 2149)  PHYSICAL EXAMINATION: General: Acutely ill-appearing female, laying in bed, on BiPAP, with moderate respiratory distress Neuro: Arouses to voice, moans, will follow simple commands, no focal deficits, pupils PERRLA HEENT: Atraumatic, normocephalic, neck supple, no JVD Cardiovascular: Regular rate and rhythm, S1-S2, no murmurs, rubs, gallops Lungs:  Diminished breath sounds bilateral bases & bibasilar rales, BiPAP assisted, mild tachypnea Abdomen: Soft, nontender, nondistended, no guarding or rebound tenderness, bowel sounds positive x4 Musculoskeletal: Normal bulk  and tone, no deformities, 2+ edema bilateral lower extremities Skin: Warm and dry.  No obvious rashes, lesions, ulcerations  Recent Labs  Lab Feb 29, 2020 1759  NA 135  K 4.3  CL 102  CO2 20*  BUN 67*  CREATININE 4.35*  GLUCOSE 119*   Recent Labs  Lab 02/29/2020 1759  HGB 10.6*  HCT 33.6*  WBC 13.8*  PLT 126*   US Renal  Result Date: Feb 29, 2020 CLINICAL DATA:  Acute renal failure. EXAM: RENAL / URINARY TRACT ULTRASOUND COMPLETE  COMPARISON:  None. FINDINGS: Right Kidney: Renal measurements: 10.1 cm x 5.3 cm x 4.3 cm = volume: 120.3 mL. There is diffusely increased echogenicity of the renal parenchyma. A 5.5 cm x 3.6 cm x 3.5 cm anechoic structure is seen within the lower pole of the right kidney. No hydronephrosis visualized. Left Kidney: Renal measurements: 9.6 cm x 4.6 cm x 4.4 cm = volume: 100.9 mL. There is diffusely increased echogenicity of the renal parenchyma. A 3.4 cm x 4.6 cm x 3.1 cm anechoic structure is seen within the upper pole of the left kidney. No hydronephrosis visualized. Bladder: Appears normal for degree of bladder distention. Other: Of incidental in note is the presence of a left pleural effusion. IMPRESSION: 1. Bilateral renal cysts. 2. Bilateral echogenic kidneys likely secondary to medical renal disease. 3. Left pleural effusion. Electronically Signed   By: Virgina Norfolk M.D.   On: 02/29/2020 20:35   DG Chest Port 1 View  Result Date: 02-29-2020 CLINICAL DATA:  Acute shortness of breath and fever. EXAM: PORTABLE CHEST 1 VIEW COMPARISON:  06/03/2016 FINDINGS: Cardiomegaly noted with bilateral interstitial and mild airspace opacities and trace bilateral pleural effusions. No pneumothorax. No acute bony abnormalities are present. IMPRESSION: Cardiomegaly with bilateral interstitial and mild airspace opacities and trace bilateral pleural effusions. Favor edema over infection. Electronically Signed   By: Margarette Canada M.D.   On: Feb 29, 2020 18:57    ASSESSMENT / PLAN:   Acute Hypoxic Respiratory Failure in the setting of new onset CHF  Hx: Asthma, COPD -Supplemental O2 as needed to maintain O2 saturations 88 to 92% -BiPAP, wean as tolerated -High risk for intubation -Follow intermittent chest x-ray and ABG as needed -Received 40 mg IV Lasix x1 in ED -Further diuresis as blood pressure and renal function permits -As needed bronchodilators  New onset CHF (unclear if systolic versus diastolic at this  time) Elevated troponin, likely demand ischemia Hx: HTN, CAD, Cardiac stents -Continuous cardiac monitoring -Maintain MAP greater than 65 -Diuresis as blood pressure and renal function permits -Trend troponin until downtrending -2D echocardiogram pending -Cardiology consulted, appreciate input  Leukocytosis, no obvious source of infection identified to date -Monitor fever curve -Trend WBCs -Check procalcitonin -Follow cultures as above -Chest x-ray without obvious infiltrate -Urinalysis negative for UTI -Can continue empiric azithromycin and Rocephin given severe respiratory distress  AKI Anion gap metabolic acidosis in setting of Lactic Acidosis & AKI -Monitor I&O's / urinary output -Follow BMP -Ensure adequate renal perfusion -Avoid nephrotoxic agents as able -Replace electrolytes as indicated -Renal ultrasound with medical renal disease -Nephrology consulted, appreciate input -Bicarb ordered by hospitalist  Mildly elevated LFT's, likely secondary to congestion due to CHF Hx: GERD -N.p.o. for now given respiratory distress -Trend LFTs -If LFTs continue to trend upward can consider abdominal ultrasound  Chronic anemia -Monitor for S/Sx of bleeding -Trend CBC -Subcu heparin for VTE Prophylaxis  -Transfuse for Hgb <7          BEST PRACTICES: GOALS OF CARE:  Full code VTE PROPHPYLAXIS: Subcu heparin CONSULTS: Hospitalist (primary service), nephrology, cardiology  UPDATES: No family present at bedside for update 01/30/2020 during NP rounds   Harlon Ditty, [facility]  Pulmonary & Critical Care Medicine Pager: [phone]  01/24/2020, 1:02 AM

## 2020-02-12 NOTE — Progress Notes (Signed)
Patient now DNR after family notified of status change.

## 2020-02-12 NOTE — Progress Notes (Signed)
[id number] Patient found unresponsive to verbal stimuli, shaking and pain. Pupils 5 mm and fixed. Cool to the touch. Warming blanket applied. No pedal or posterior tibial pulses felt or doppler ed. Lower extremities cool to the touch and mottled. Heels purple in color and cold. Dr. Belia Heman notified. Patient assessed per Dr. Belia Heman. B/P and heart rate present. Currently on BiPAP at 100%. 12/5 back up rate of 16. No spontaneous respirations noted. Carotid and bilateral radial arteries also doppler ed. Dr. Belia Heman attempting to call family.

## 2020-02-12 NOTE — Telephone Encounter (Signed)
Copied from CRM [id number]. Topic: General - Deceased Patient >> 02-22-2020  2:40 PM Angela Nevin wrote: Reason for CRM: Patient's son calling to inform Dr. Sullivan Lone that patient died this morning.   Route to department's PEC Pool.

## 2020-02-12 NOTE — Progress Notes (Signed)
Patient died at 49. Family present with patient.

## 2020-02-12 NOTE — Progress Notes (Signed)
Clinical status relayed to family SON RODNEY JOHNSON  Updated and notified of patients medical condition-  Progressive multiorgan failure with very low chance of meaningful recovery.  Patient is in dying  Process.  Family understands the situation.  They have consented and agreed to DNR for now    Family are satisfied with Plan of action and management. All questions answered  Lucie Leather, M.D.  [facility]  Medical Director [facility] Medical Director [facility] Cardio-Pulmonary Department

## 2020-02-12 DEATH — deceased

## 2020-03-23 ENCOUNTER — Ambulatory Visit: Payer: Self-pay | Admitting: Family Medicine
